# Patient Record
Sex: Male | Born: 1955 | Race: Black or African American | Hispanic: No | Smoking: Current some day smoker
Health system: Southern US, Community
[De-identification: ages and names within clinical notes are randomized; demographics above are authoritative.]

## PROBLEM LIST (undated history)

## (undated) DIAGNOSIS — I10 Essential (primary) hypertension: Secondary | ICD-10-CM

## (undated) DIAGNOSIS — I639 Cerebral infarction, unspecified: Secondary | ICD-10-CM

## (undated) HISTORY — DX: Cerebral infarction, unspecified: I63.9

## (undated) HISTORY — PX: SHOULDER SURGERY: SHX246

## (undated) HISTORY — DX: Essential (primary) hypertension: I10

---

## 2012-06-09 ENCOUNTER — Emergency Department (HOSPITAL_BASED_OUTPATIENT_CLINIC_OR_DEPARTMENT_OTHER): Payer: No Typology Code available for payment source

## 2012-06-09 ENCOUNTER — Emergency Department (HOSPITAL_BASED_OUTPATIENT_CLINIC_OR_DEPARTMENT_OTHER)
Admission: EM | Admit: 2012-06-09 | Discharge: 2012-06-09 | Disposition: A | Discharge status: Home / Self Care | Payer: No Typology Code available for payment source | Attending: Emergency Medicine | Admitting: Emergency Medicine

## 2012-06-09 ENCOUNTER — Encounter (HOSPITAL_BASED_OUTPATIENT_CLINIC_OR_DEPARTMENT_OTHER): Payer: Self-pay

## 2012-06-09 DIAGNOSIS — M542 Cervicalgia: Secondary | ICD-10-CM | POA: Insufficient documentation

## 2012-06-09 DIAGNOSIS — S335XXA Sprain of ligaments of lumbar spine, initial encounter: Secondary | ICD-10-CM | POA: Insufficient documentation

## 2012-06-09 DIAGNOSIS — S161XXA Strain of muscle, fascia and tendon at neck level, initial encounter: Secondary | ICD-10-CM

## 2012-06-09 DIAGNOSIS — S139XXA Sprain of joints and ligaments of unspecified parts of neck, initial encounter: Secondary | ICD-10-CM | POA: Insufficient documentation

## 2012-06-09 DIAGNOSIS — S39012A Strain of muscle, fascia and tendon of lower back, initial encounter: Secondary | ICD-10-CM

## 2012-06-09 DIAGNOSIS — R51 Headache: Secondary | ICD-10-CM | POA: Insufficient documentation

## 2012-06-09 MED ORDER — HYDROCODONE-ACETAMINOPHEN 5-325 MG PO TABS: 1.0000 | ORAL_TABLET | Freq: Four times a day (QID) | ORAL | Status: AC | PRN

## 2012-06-09 MED ORDER — NAPROXEN 500 MG PO TABS: 500.0000 mg | ORAL_TABLET | Freq: Two times a day (BID) | ORAL | Status: AC
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
## 2016-09-29 ENCOUNTER — Emergency Department (HOSPITAL_BASED_OUTPATIENT_CLINIC_OR_DEPARTMENT_OTHER)
Admission: EM | Admit: 2016-09-29 | Discharge: 2016-09-29 | Disposition: A | Discharge status: Home / Self Care | Payer: Worker's Compensation | Attending: Emergency Medicine | Admitting: Emergency Medicine

## 2016-09-29 ENCOUNTER — Encounter (HOSPITAL_BASED_OUTPATIENT_CLINIC_OR_DEPARTMENT_OTHER): Payer: Self-pay | Admitting: Emergency Medicine

## 2016-09-29 DIAGNOSIS — F17210 Nicotine dependence, cigarettes, uncomplicated: Secondary | ICD-10-CM | POA: Insufficient documentation

## 2016-09-29 DIAGNOSIS — Z7984 Long term (current) use of oral hypoglycemic drugs: Secondary | ICD-10-CM | POA: Insufficient documentation

## 2016-09-29 DIAGNOSIS — E119 Type 2 diabetes mellitus without complications: Secondary | ICD-10-CM | POA: Insufficient documentation

## 2016-09-29 DIAGNOSIS — Y92811 Bus as the place of occurrence of the external cause: Secondary | ICD-10-CM | POA: Insufficient documentation

## 2016-09-29 DIAGNOSIS — Y939 Activity, unspecified: Secondary | ICD-10-CM | POA: Insufficient documentation

## 2016-09-29 DIAGNOSIS — W1839XA Other fall on same level, initial encounter: Secondary | ICD-10-CM | POA: Insufficient documentation

## 2016-09-29 DIAGNOSIS — S39012A Strain of muscle, fascia and tendon of lower back, initial encounter: Secondary | ICD-10-CM | POA: Insufficient documentation

## 2016-09-29 DIAGNOSIS — Y990 Civilian activity done for income or pay: Secondary | ICD-10-CM | POA: Diagnosis not present

## 2016-09-29 DIAGNOSIS — S3992XA Unspecified injury of lower back, initial encounter: Secondary | ICD-10-CM | POA: Diagnosis present

## 2016-09-29 DIAGNOSIS — M5127 Other intervertebral disc displacement, lumbosacral region: Secondary | ICD-10-CM | POA: Diagnosis not present

## 2016-09-29 MED ORDER — METHYLPREDNISOLONE 4 MG PO TBPK: ORAL_TABLET | ORAL | 0 refills | Status: DC

## 2016-09-29 MED ORDER — CYCLOBENZAPRINE HCL 10 MG PO TABS: 10.0000 mg | ORAL_TABLET | Freq: Three times a day (TID) | ORAL | 0 refills | Status: DC | PRN

## 2016-09-29 MED ORDER — HYDROCODONE-ACETAMINOPHEN 5-325 MG PO TABS: 1.0000 | ORAL_TABLET | ORAL | 0 refills | Status: DC | PRN

## 2016-09-29 MED FILL — CYCLOBENZAPRINE 10 MG TAB: 10 | 10 days supply | Qty: 30 | Fill #0

## 2016-09-29 MED FILL — HYDROCODON-APAP 5-325: 5-325 | 3 days supply | Qty: 20 | Fill #0

## 2016-09-29 MED FILL — METHYLPREDNISOLONE 4 MG TAB: 4 | 6 days supply | Qty: 21 | Fill #0
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
## 2016-09-29 NOTE — ED Notes (Signed)
Pt verbalized understanding of discharge instructions and denies any further questions at this time.   

## 2016-09-29 NOTE — ED Notes (Signed)
ED Provider at bedside. 

## 2020-01-17 LAB — COLOGUARD: Cologuard: NEGATIVE

## 2022-01-16 ENCOUNTER — Encounter: Payer: Self-pay | Admitting: Nurse Practitioner

## 2022-01-16 ENCOUNTER — Ambulatory Visit (INDEPENDENT_AMBULATORY_CARE_PROVIDER_SITE_OTHER): Payer: Medicare (Managed Care) | Admitting: Nurse Practitioner

## 2022-01-16 VITALS — BP 122/74 | HR 91 | Temp 97.1°F | Ht 67.0 in | Wt 138.8 lb

## 2022-01-16 DIAGNOSIS — E119 Type 2 diabetes mellitus without complications: Secondary | ICD-10-CM

## 2022-01-16 DIAGNOSIS — Z720 Tobacco use: Secondary | ICD-10-CM | POA: Diagnosis not present

## 2022-01-16 DIAGNOSIS — E1140 Type 2 diabetes mellitus with diabetic neuropathy, unspecified: Secondary | ICD-10-CM | POA: Insufficient documentation

## 2022-01-16 MED ORDER — METFORMIN HCL ER 500 MG PO TB24: 500.0000 mg | ORAL_TABLET | Freq: Every day | ORAL | 1 refills | Status: DC

## 2022-01-16 NOTE — Assessment & Plan Note (Signed)
Currently smoking half a pack a day.  Encouraged complete tobacco cessation.  We will discuss low-dose lung CT and abdominal aortic aneurysm screening next visit. ?

## 2022-01-16 NOTE — Patient Instructions (Signed)
It was great to see you! ? ?We are checking your labs today and will call you with the results.  ? ?Let's follow-up in 3 months, sooner if you have concerns. ? ?If a referral was placed today, you will be contacted for an appointment. Please note that routine referrals can sometimes take up to 3-4 weeks to process. Please call our office if you haven't heard anything after this time frame. ? ?Take care, ? ?Vance Peper, NP ? ?

## 2022-01-16 NOTE — Assessment & Plan Note (Addendum)
We do not have previous records or lab results.  He was going to the [place] however he states that it was hard to get into see them.  We will check an A1c, CMP, CBC, lipid panel, urine microalbumin today.  Refill of metformin sent to the pharmacy 500 mg daily.  He does not take his medications routinely.  He was on a statin in the past however he stopped this for unknown reasons.  He is not taking any medications for kidney protection.  We will await lab results before starting any new medications.  Will adjust regimen based on A1c results.  He has not been to an eye doctor in a few years, recommended that he see one as soon as he is able to.  Discussed foot care, and he states that he does daily foot exams at home.  Follow-up in 3 months or sooner with any concerns or lab results that need to be followed up on. ?

## 2022-01-16 NOTE — Progress Notes (Signed)
? ?New Patient Office Visit ? ?Subjective:  ?Patient ID: Gary Gay, male    DOB: 1956-03-23  Age: 66 y.o. MRN: [id number] ? ?CC:  ?Chief Complaint  ?Patient presents with  ? Establish Care  ?  Np. Est care. Pt requesting blood sugars check  ? ? ?HPI ?Gary Gay presents for new patient visit to establish care.  Introduced to Designer, jewellery role and practice setting.  All questions answered.  Discussed provider/patient relationship and expectations. ? ?Gary Gay has a history of diabetes.  He takes metformin 500 mg daily if he remembers.  He also takes aspirin 81 mg about every other day.  He checks his blood sugars occasionally and they have ranged from 160s to 240s, both fasting and after eating.  He does not check his blood pressure at home.  He was taking a cholesterol medication in the past, however he stopped taking it for no particular reason.  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 ? ?DIABETES ? ?Hypoglycemic episodes:no ?Polydipsia/polyuria: no ?Visual disturbance: no ?Chest pain: no ?Paresthesias: no ?Glucose Monitoring: yes ? Accucheck frequency:  intermittent ? Fasting glucose: 160-247 ? Post prandial: ? Evening: ? Before meals: ?Taking Insulin?: no ? Long acting insulin: ? Short acting insulin: ?Blood Pressure Monitoring: rarely ?Retinal Examination: Not up to Date ?Foot Exam: Not up to Date ?Diabetic Education: Completed ?Pneumovax: Not up to Date ?Influenza: Not up to Date ?Aspirin: yes ? ? ?Past Medical History:  ?Diagnosis Date  ? Diabetes mellitus   ? ? ?Past Surgical History:  ?Procedure Laterality Date  ? SHOULDER SURGERY    ? ? ?Family History  ?Problem Relation Age of Onset  ? Cancer Sister   ?     pancreatic  ? Diabetes Maternal Grandmother   ? ? ?Social History  ? ?Socioeconomic History  ? Marital status: Divorced  ?  Spouse name: Not on file  ? Number of children: Not on file  ? Years of education: Not on file  ? Highest education level: Not on file   ?Occupational History  ? Not on file  ?Tobacco Use  ? Smoking status: Every Day  ?  Packs/day: 0.50  ?  Years: 49.00  ?  Pack years: 24.50  ?  Types: Cigarettes  ? Smokeless tobacco: Never  ?Vaping Use  ? Vaping Use: Never used  ?Substance and Sexual Activity  ? Alcohol use: Not Currently  ? Drug use: No  ? Sexual activity: Not on file  ?Other Topics Concern  ? Not on file  ?Social History Narrative  ? Not on file  ? ?Social Determinants of Health  ? ?Financial Resource Strain: Not on file  ?Food Insecurity: Not on file  ?Transportation Needs: Not on file  ?Physical Activity: Not on file  ?Stress: Not on file  ?Social Connections: Not on file  ?Intimate Partner Violence: Not on file  ? ? ?ROS ?Review of Systems  ?Constitutional: Negative.   ?HENT:  Positive for congestion. Negative for rhinorrhea and sore throat.   ?Eyes: Negative.   ?Respiratory: Negative.    ?Cardiovascular: Negative.   ?Gastrointestinal: Negative.   ?Genitourinary: Negative.   ?Musculoskeletal: Negative.   ?Skin:  Positive for rash (right elbow).  ?Neurological: Negative.   ?Psychiatric/Behavioral: Negative.    ? ?Objective:  ? ?Today's Vitals: BP 122/74 (BP Location: Left Arm, Cuff Size: Normal)   Pulse 91   Temp (!) 97.1 ?F (36.2 ?C) (Temporal)   Ht '5\' 7"'$  (1.702 m)  Wt 138 lb 12.8 oz (63 kg)   SpO2 99%   BMI 21.74 kg/m?  ? ?Physical Exam ?Vitals and nursing note reviewed.  ?Constitutional:   ?   Appearance: Normal appearance.  ?HENT:  ?   Head: Normocephalic and atraumatic.  ?   Right Ear: Tympanic membrane, ear canal and external ear normal.  ?   Left Ear: Tympanic membrane, ear canal and external ear normal.  ?   Nose: Nose normal.  ?   Mouth/Throat:  ?   Mouth: Mucous membranes are moist.  ?   Pharynx: Oropharynx is clear.  ?Eyes:  ?   Conjunctiva/sclera: Conjunctivae normal.  ?Cardiovascular:  ?   Rate and Rhythm: Normal rate and regular rhythm.  ?   Pulses: Normal pulses.  ?   Heart sounds: Normal heart sounds.  ?Pulmonary:  ?    Effort: Pulmonary effort is normal.  ?   Breath sounds: Normal breath sounds.  ?Abdominal:  ?   General: Bowel sounds are normal.  ?   Palpations: Abdomen is soft.  ?   Tenderness: There is no abdominal tenderness.  ?Musculoskeletal:     ?   General: Normal range of motion.  ?   Cervical back: Normal range of motion and neck supple. No tenderness.  ?   Right lower leg: No edema.  ?   Left lower leg: No edema.  ?Lymphadenopathy:  ?   Cervical: No cervical adenopathy.  ?Skin: ?   General: Skin is warm and dry.  ?Neurological:  ?   General: No focal deficit present.  ?   Mental Status: He is alert and oriented to person, place, and time.  ?   Cranial Nerves: No cranial nerve deficit.  ?   Gait: Gait normal.  ?   Deep Tendon Reflexes: Reflexes normal.  ?Psychiatric:     ?   Mood and Affect: Mood normal.     ?   Behavior: Behavior normal.     ?   Thought Content: Thought content normal.     ?   Judgment: Judgment normal.  ? ?Diabetic Foot Exam - Simple   ?Simple Foot Form ?Visual Inspection ?No deformities, no ulcerations, no other skin breakdown bilaterally: Yes ?Sensation Testing ?Intact to touch and monofilament testing bilaterally: Yes ?Pulse Check ?Posterior Tibialis and Dorsalis pulse intact bilaterally: Yes ?Comments ?  ? ? ?Assessment & Plan:  ? ?Problem List Items Addressed This Visit   ? ?  ? Endocrine  ? Diabetes mellitus without complication ([place]) - Primary  ?  We do not have previous records or lab results.  He was going to the [place] however he states that it was hard to get into see them.  We will check an A1c, CMP, CBC, lipid panel, urine microalbumin today.  Refill of metformin sent to the pharmacy 500 mg daily.  He does not take his medications routinely.  He was on a statin in the past however he stopped this for unknown reasons.  He is not taking any medications for kidney protection.  We will await lab results before starting any new medications.  Will adjust regimen based on A1c results.  He has not  been to an eye doctor in a few years, recommended that he see one as soon as he is able to.  Discussed foot care, and he states that he does daily foot exams at home.  Follow-up in 3 months or sooner with any concerns or lab results that need to be  followed up on. ?  ?  ? Relevant Medications  ? aspirin 81 MG EC tablet  ? metFORMIN (GLUCOPHAGE-XR) 500 MG 24 hr tablet  ? Other Relevant Orders  ? CBC with Differential/Platelet  ? Comprehensive metabolic panel  ? Hemoglobin A1c  ? Lipid panel  ? Microalbumin / creatinine urine ratio  ?  ? Other  ? Tobacco use  ?  Currently smoking half a pack a day.  Encouraged complete tobacco cessation.  We will discuss low-dose lung CT and abdominal aortic aneurysm screening next visit. ?  ?  ? ? ?Outpatient Encounter Medications as of 01/16/2022  ?Medication Sig  ? aspirin 81 MG EC tablet TAKE ONE TABLET BY MOUTH AS DIRECTED BY YOUR MEDICAL PROVIDER  ? metFORMIN (GLUCOPHAGE-XR) 500 MG 24 hr tablet Take 1 tablet (500 mg total) by mouth daily with breakfast.  ? [DISCONTINUED] cyclobenzaprine (FLEXERIL) 10 MG tablet Take 1 tablet (10 mg total) by mouth 3 (three) times daily as needed for muscle spasms.  ? [DISCONTINUED] HYDROcodone-acetaminophen (NORCO/VICODIN) 5-325 MG tablet Take 1-2 tablets by mouth every 4 (four) hours as needed for moderate pain or severe pain.  ? [DISCONTINUED] metFORMIN (GLUCOPHAGE-XR) 500 MG 24 hr tablet Take 500 mg by mouth daily with breakfast.  ? [DISCONTINUED] metFORMIN (GLUCOPHAGE-XR) 500 MG 24 hr tablet Take 1 tablet (500 mg total) by mouth daily with breakfast.  ? [DISCONTINUED] methylPREDNISolone (MEDROL DOSEPAK) 4 MG TBPK tablet Take per pack instruction  ? ?No facility-administered encounter medications on file as of 01/16/2022.  ? ? ?Follow-up: Return in about 3 months (around 04/18/2022) for Diabetes.  ? ?Charyl Dancer, NP ? ?

## 2022-01-17 LAB — LIPID PANEL
Cholesterol: 169 mg/dL (ref 0–200)
HDL: 40 mg/dL (ref >39.00)
LDL Cholesterol: 107 mg/dL — ABNORMAL HIGH (ref 0–99)
NonHDL: 128.99
Total CHOL/HDL Ratio: 4
Triglycerides: 108 mg/dL (ref 0.0–149.0)
VLDL: 21.6 mg/dL (ref 0.0–40.0)

## 2022-01-17 LAB — COMPREHENSIVE METABOLIC PANEL
ALT: 8 U/L (ref 0–53)
AST: 24 U/L (ref 0–37)
Albumin: 4.7 g/dL (ref 3.5–5.2)
Alkaline Phosphatase: 102 U/L (ref 39–117)
BUN: 4 mg/dL — ABNORMAL LOW (ref 6–23)
CO2: 31 mEq/L (ref 19–32)
Calcium: 9.7 mg/dL (ref 8.4–10.5)
Chloride: 99 mEq/L (ref 96–112)
Creatinine, Ser: 0.98 mg/dL (ref 0.40–1.50)
GFR: 80.92 mL/min (ref >60.00)
Glucose, Bld: 242 mg/dL — ABNORMAL HIGH (ref 70–99)
Potassium: 4.1 mEq/L (ref 3.5–5.1)
Sodium: 137 mEq/L (ref 135–145)
Total Bilirubin: 0.6 mg/dL (ref 0.2–1.2)
Total Protein: 7.9 g/dL (ref 6.0–8.3)

## 2022-01-17 LAB — MICROALBUMIN / CREATININE URINE RATIO
Creatinine,U: 67.6 mg/dL
Microalb Creat Ratio: 1.2 mg/g (ref 0.0–30.0)
Microalb, Ur: 0.8 mg/dL (ref 0.0–1.9)

## 2022-01-17 LAB — CBC WITH DIFFERENTIAL/PLATELET
Basophils Absolute: 0.1 10*3/uL (ref 0.0–0.1)
Basophils Relative: 1.5 % (ref 0.0–3.0)
Eosinophils Absolute: 0.2 10*3/uL (ref 0.0–0.7)
Eosinophils Relative: 2.9 % (ref 0.0–5.0)
HCT: 35.5 % — ABNORMAL LOW (ref 39.0–52.0)
Hemoglobin: 11.6 g/dL — ABNORMAL LOW (ref 13.0–17.0)
Lymphocytes Relative: 47.4 % — ABNORMAL HIGH (ref 12.0–46.0)
Lymphs Abs: 2.6 10*3/uL (ref 0.7–4.0)
MCHC: 32.6 g/dL (ref 30.0–36.0)
MCV: 82.3 fl (ref 78.0–100.0)
Monocytes Absolute: 0.4 10*3/uL (ref 0.1–1.0)
Monocytes Relative: 6.8 % (ref 3.0–12.0)
Neutro Abs: 2.3 10*3/uL (ref 1.4–7.7)
Neutrophils Relative %: 41.4 % — ABNORMAL LOW (ref 43.0–77.0)
Platelets: 398 10*3/uL (ref 150.0–400.0)
RBC: 4.32 Mil/uL (ref 4.22–5.81)
RDW: 14.4 % (ref 11.5–15.5)
WBC: 5.5 10*3/uL (ref 4.0–10.5)

## 2022-01-17 LAB — HEMOGLOBIN A1C: Hgb A1c MFr Bld: 12.8 % — ABNORMAL HIGH (ref 4.6–6.5)

## 2022-01-20 NOTE — Progress Notes (Signed)
Called and informed patient of results and provider instructions. Patient voiced understanding. Scheduled pt for f/u appt 01/21/22 '@920'$ 

## 2022-01-21 ENCOUNTER — Ambulatory Visit (INDEPENDENT_AMBULATORY_CARE_PROVIDER_SITE_OTHER): Payer: Medicare (Managed Care) | Admitting: Nurse Practitioner

## 2022-01-21 ENCOUNTER — Other Ambulatory Visit: Payer: Self-pay

## 2022-01-21 ENCOUNTER — Encounter: Payer: Self-pay | Admitting: Nurse Practitioner

## 2022-01-21 VITALS — BP 130/88 | HR 83 | Temp 96.5°F | Wt 135.2 lb

## 2022-01-21 DIAGNOSIS — E1165 Type 2 diabetes mellitus with hyperglycemia: Secondary | ICD-10-CM | POA: Diagnosis not present

## 2022-01-21 DIAGNOSIS — E119 Type 2 diabetes mellitus without complications: Secondary | ICD-10-CM

## 2022-01-21 MED ORDER — EMPAGLIFLOZIN 10 MG PO TABS: 10.0000 mg | ORAL_TABLET | Freq: Every day | ORAL | 2 refills | Status: DC

## 2022-01-21 MED ORDER — METFORMIN HCL ER 500 MG PO TB24: 1000.0000 mg | ORAL_TABLET | Freq: Every day | ORAL | 0 refills | Status: DC

## 2022-01-21 MED ORDER — LISINOPRIL 5 MG PO TABS: 5.0000 mg | ORAL_TABLET | Freq: Every day | ORAL | 2 refills | Status: DC

## 2022-01-21 NOTE — Assessment & Plan Note (Signed)
Chronic, not controlled. Gary Gay's A1C was 12.8% on 01/17/22. We discussed the importance of limiting sugars including soda and sweet tea, along with the importance of taking his medicine. We discussed possible complications of elevated blood sugars. Encouraged him to continue taking metformin ER 1,'000mg'$  daily. Will also start Jardiance '10mg'$  daily. Note written for [facility] physical provider letting them know Fox is starting more treatment for his diabetes and encouraged Tyke to contact that provider to see what else he needs to do. Will also start lisinopril '5mg'$  daily for kidney protection. He was prescribed a cholesterol medication in the past and has it at home. Encouraged him to call with the name and dose of the medication so his chart can be updated. Follow up in 4 weeks.  ?

## 2022-01-21 NOTE — Patient Instructions (Signed)
It was great to see you! ? ?Start jardiance '10mg'$  (1 tablet) daily to help bring your blood sugars down.  ? ?Start lisinopril '5mg'$  (1 tablet) daily to help protect your kidneys.  ? ?Keep checking your blood sugar in the morning when you wake up before you eat.  ? ?Let's follow-up in 1 month, sooner if you have concerns. ? ?If a referral was placed today, you will be contacted for an appointment. Please note that routine referrals can sometimes take up to 3-4 weeks to process. Please call our office if you haven't heard anything after this time frame. ? ?Take care, ? ?Vance Peper, NP ? ? ?

## 2022-01-21 NOTE — Progress Notes (Signed)
? ?Established Patient Office Visit ? ?Subjective:  ?Patient ID: Jewelz Kobus, male    DOB: Jun 27, 1956  Age: 66 y.o. MRN: [id number] ? ?CC:  ?Chief Complaint  ?Patient presents with  ? Blood Sugar Problem  ?  F/u elevated BS  ? ? ?HPI ?Jevante Hollibaugh presents for follow-up on blood sugars. His last A1C was 12.8%. He endorses drinking a lot of pepsi and sweet tea. He has tried drinking diet pepsi, but he didn't like it. He has also tried to cut back on drinking soda and tea, however he was unable to do so. He sometimes does not remember to take his metformin. He has a glucometer at home but doesn't check his blood sugar routinely. He denies chest pain, shortness of breath, dysuria, changes in vision, and neuropathy.  ? ?Past Medical History:  ?Diagnosis Date  ? Diabetes mellitus   ? ? ?Past Surgical History:  ?Procedure Laterality Date  ? SHOULDER SURGERY    ? ? ?Family History  ?Problem Relation Age of Onset  ? Cancer Sister   ?     pancreatic  ? Diabetes Maternal Grandmother   ? ? ?Social History  ? ?Socioeconomic History  ? Marital status: Divorced  ?  Spouse name: Not on file  ? Number of children: Not on file  ? Years of education: Not on file  ? Highest education level: Not on file  ?Occupational History  ? Not on file  ?Tobacco Use  ? Smoking status: Every Day  ?  Packs/day: 0.50  ?  Years: 49.00  ?  Pack years: 24.50  ?  Types: Cigarettes  ? Smokeless tobacco: Never  ?Vaping Use  ? Vaping Use: Never used  ?Substance and Sexual Activity  ? Alcohol use: Not Currently  ? Drug use: No  ? Sexual activity: Not on file  ?Other Topics Concern  ? Not on file  ?Social History Narrative  ? Not on file  ? ?Social Determinants of Health  ? ?Financial Resource Strain: Not on file  ?Food Insecurity: Not on file  ?Transportation Needs: Not on file  ?Physical Activity: Not on file  ?Stress: Not on file  ?Social Connections: Not on file  ?Intimate Partner Violence: Not on file  ? ? ?Outpatient Medications Prior to Visit   ?Medication Sig Dispense Refill  ? aspirin 81 MG EC tablet TAKE ONE TABLET BY MOUTH AS DIRECTED BY YOUR MEDICAL PROVIDER    ? metFORMIN (GLUCOPHAGE-XR) 500 MG 24 hr tablet Take 1 tablet (500 mg total) by mouth daily with breakfast. 90 tablet 1  ? ?No facility-administered medications prior to visit.  ? ? ?No Known Allergies ? ?ROS ?Review of Systems ?See pertinent positives and negatives per HPI. ?  ?Objective:  ?  ?Physical Exam ?Vitals and nursing note reviewed.  ?Constitutional:   ?   Appearance: Normal appearance.  ?HENT:  ?   Head: Normocephalic.  ?Eyes:  ?   Conjunctiva/sclera: Conjunctivae normal.  ?Cardiovascular:  ?   Rate and Rhythm: Normal rate and regular rhythm.  ?   Pulses: Normal pulses.  ?   Heart sounds: Normal heart sounds.  ?Pulmonary:  ?   Effort: Pulmonary effort is normal.  ?   Breath sounds: Normal breath sounds.  ?Musculoskeletal:  ?   Cervical back: Normal range of motion.  ?Skin: ?   General: Skin is warm and dry.  ?Neurological:  ?   General: No focal deficit present.  ?   Mental Status: He is alert and  oriented to person, place, and time.  ?Psychiatric:     ?   Mood and Affect: Mood normal.     ?   Behavior: Behavior normal.     ?   Thought Content: Thought content normal.     ?   Judgment: Judgment normal.  ? ? ?BP 130/88 (BP Location: Left Arm, Patient Position: Sitting, Cuff Size: Normal)   Pulse 83   Temp (!) 96.5 ?F (35.8 ?C) (Temporal)   Wt 135 lb 3.2 oz (61.3 kg)   SpO2 98%   BMI 21.18 kg/m?  ?Wt Readings from Last 3 Encounters:  ?01/21/22 135 lb 3.2 oz (61.3 kg)  ?01/16/22 138 lb 12.8 oz (63 kg)  ?09/29/16 149 lb (67.6 kg)  ? ? ? ?Health Maintenance Due  ?Topic Date Due  ? FOOT EXAM  Never done  ? OPHTHALMOLOGY EXAM  Never done  ? HIV Screening  Never done  ? Hepatitis C Screening  Never done  ? COLONOSCOPY (Pts 45-45yr Insurance coverage will need to be confirmed)  Never done  ? Zoster Vaccines- Shingrix (1 of 2) Never done  ? Pneumonia Vaccine 66 Years old (2 - PCV)  04/09/2016  ? ? ?There are no preventive care reminders to display for this patient. ? ?No results found for: TSH ?Lab Results  ?Component Value Date  ? WBC 5.5 01/16/2022  ? HGB 11.6 (L) 01/16/2022  ? HCT 35.5 (L) 01/16/2022  ? MCV 82.3 01/16/2022  ? PLT 398.0 01/16/2022  ? ?Lab Results  ?Component Value Date  ? NA 137 01/16/2022  ? K 4.1 01/16/2022  ? CO2 31 01/16/2022  ? GLUCOSE 242 (H) 01/16/2022  ? BUN 4 (L) 01/16/2022  ? CREATININE 0.98 01/16/2022  ? BILITOT 0.6 01/16/2022  ? ALKPHOS 102 01/16/2022  ? AST 24 01/16/2022  ? ALT 8 01/16/2022  ? PROT 7.9 01/16/2022  ? ALBUMIN 4.7 01/16/2022  ? CALCIUM 9.7 01/16/2022  ? GFR 80.92 01/16/2022  ? ?Lab Results  ?Component Value Date  ? CHOL 169 01/16/2022  ? ?Lab Results  ?Component Value Date  ? HDL 40.00 01/16/2022  ? ?Lab Results  ?Component Value Date  ? LDLCALC 107 (H) 01/16/2022  ? ?Lab Results  ?Component Value Date  ? TRIG 108.0 01/16/2022  ? ?Lab Results  ?Component Value Date  ? CHOLHDL 4 01/16/2022  ? ?Lab Results  ?Component Value Date  ? HGBA1C 12.8 Repeated and verified X2. (H) 01/16/2022  ? ? ?  ?Assessment & Plan:  ? ?Problem List Items Addressed This Visit   ? ?  ? Endocrine  ? Diabetes mellitus without complication ([facility] - Primary  ?  Chronic, not controlled. Vernis's A1C was 12.8% on 01/17/22. We discussed the importance of limiting sugars including soda and sweet tea, along with the importance of taking his medicine. We discussed possible complications of elevated blood sugars. Encouraged him to continue taking metformin ER 1,'000mg'$  daily. Will also start Jardiance '10mg'$  daily. Note written for [facility] physical provider letting them know MChinonsois starting more treatment for his diabetes and encouraged MTrayvondto contact that provider to see what else he needs to do. Will also start lisinopril '5mg'$  daily for kidney protection. He was prescribed a cholesterol medication in the past and has it at home. Encouraged him to call with the name and dose of the  medication so his chart can be updated. Follow up in 4 weeks.  ?  ?  ? Relevant Medications  ? empagliflozin (JARDIANCE) 10  MG TABS tablet  ? metFORMIN (GLUCOPHAGE-XR) 500 MG 24 hr tablet  ? lisinopril (ZESTRIL) 5 MG tablet  ? ? ?Meds ordered this encounter  ?Medications  ? empagliflozin (JARDIANCE) 10 MG TABS tablet  ?  Sig: Take 1 tablet (10 mg total) by mouth daily before breakfast.  ?  Dispense:  30 tablet  ?  Refill:  2  ? metFORMIN (GLUCOPHAGE-XR) 500 MG 24 hr tablet  ?  Sig: Take 2 tablets (1,000 mg total) by mouth daily with breakfast.  ?  Dispense:  180 tablet  ?  Refill:  0  ? lisinopril (ZESTRIL) 5 MG tablet  ?  Sig: Take 1 tablet (5 mg total) by mouth daily.  ?  Dispense:  30 tablet  ?  Refill:  2  ? ? ?Follow-up: Return in about 4 weeks (around 02/18/2022) for Diabetes.  ? ? ?Charyl Dancer, NP ?

## 2022-02-03 ENCOUNTER — Telehealth: Payer: Self-pay | Admitting: Nurse Practitioner

## 2022-02-03 ENCOUNTER — Ambulatory Visit: Payer: Medicare (Managed Care) | Admitting: Nurse Practitioner

## 2022-02-03 NOTE — Telephone Encounter (Signed)
Patient/Caregiver was notified of No Show/Late Cancellation Policy & possible $41 charge. ?Visit was cancelled with reason "No Show/Cancel within 24 hours" for tracking & charging. ? ?Caller Name: Ygnacio Edelson  ?Caller Ph #: [phone] ?Date of APPT: 02/03/22 ?Reason given for no show/late cancellation: he is broken down on side of the road.  ?No Show Letter printed & put in outgoing mail (Yes/No): no I didn't send, not sure if I should ? ?~~~Route message to admin supervisor and clinical team/CMA~~~ ? ? ?

## 2022-02-04 NOTE — Telephone Encounter (Signed)
No, not counted as no show/late cancellation due to circumstance. No fee either. ?

## 2022-02-24 ENCOUNTER — Emergency Department (HOSPITAL_BASED_OUTPATIENT_CLINIC_OR_DEPARTMENT_OTHER): Payer: Medicare (Managed Care)

## 2022-02-24 ENCOUNTER — Encounter (HOSPITAL_BASED_OUTPATIENT_CLINIC_OR_DEPARTMENT_OTHER): Payer: Self-pay

## 2022-02-24 ENCOUNTER — Observation Stay (HOSPITAL_BASED_OUTPATIENT_CLINIC_OR_DEPARTMENT_OTHER)
Admission: EM | Admit: 2022-02-24 | Discharge: 2022-02-25 | Disposition: A | Discharge status: Home / Self Care | Payer: Medicare (Managed Care) | Attending: Internal Medicine | Admitting: Internal Medicine

## 2022-02-24 ENCOUNTER — Other Ambulatory Visit: Payer: Self-pay

## 2022-02-24 DIAGNOSIS — E1169 Type 2 diabetes mellitus with other specified complication: Secondary | ICD-10-CM | POA: Insufficient documentation

## 2022-02-24 DIAGNOSIS — Z720 Tobacco use: Secondary | ICD-10-CM | POA: Diagnosis present

## 2022-02-24 DIAGNOSIS — I10 Essential (primary) hypertension: Secondary | ICD-10-CM | POA: Diagnosis present

## 2022-02-24 DIAGNOSIS — M4807 Spinal stenosis, lumbosacral region: Secondary | ICD-10-CM | POA: Diagnosis not present

## 2022-02-24 DIAGNOSIS — M79604 Pain in right leg: Secondary | ICD-10-CM | POA: Insufficient documentation

## 2022-02-24 DIAGNOSIS — R200 Anesthesia of skin: Secondary | ICD-10-CM | POA: Diagnosis present

## 2022-02-24 DIAGNOSIS — Z7984 Long term (current) use of oral hypoglycemic drugs: Secondary | ICD-10-CM | POA: Insufficient documentation

## 2022-02-24 DIAGNOSIS — Z79899 Other long term (current) drug therapy: Secondary | ICD-10-CM | POA: Insufficient documentation

## 2022-02-24 DIAGNOSIS — C07 Malignant neoplasm of parotid gland: Secondary | ICD-10-CM | POA: Diagnosis not present

## 2022-02-24 DIAGNOSIS — E785 Hyperlipidemia, unspecified: Secondary | ICD-10-CM | POA: Diagnosis not present

## 2022-02-24 DIAGNOSIS — E1140 Type 2 diabetes mellitus with diabetic neuropathy, unspecified: Secondary | ICD-10-CM

## 2022-02-24 DIAGNOSIS — I639 Cerebral infarction, unspecified: Principal | ICD-10-CM | POA: Insufficient documentation

## 2022-02-24 DIAGNOSIS — Z7982 Long term (current) use of aspirin: Secondary | ICD-10-CM | POA: Diagnosis not present

## 2022-02-24 DIAGNOSIS — I6381 Other cerebral infarction due to occlusion or stenosis of small artery: Secondary | ICD-10-CM

## 2022-02-24 DIAGNOSIS — E119 Type 2 diabetes mellitus without complications: Secondary | ICD-10-CM

## 2022-02-24 DIAGNOSIS — F17210 Nicotine dependence, cigarettes, uncomplicated: Secondary | ICD-10-CM | POA: Diagnosis not present

## 2022-02-24 DIAGNOSIS — I8291 Chronic embolism and thrombosis of unspecified vein: Secondary | ICD-10-CM | POA: Insufficient documentation

## 2022-02-24 DIAGNOSIS — I8290 Acute embolism and thrombosis of unspecified vein: Secondary | ICD-10-CM | POA: Diagnosis present

## 2022-02-24 HISTORY — DX: Other cerebral infarction due to occlusion or stenosis of small artery: I63.81

## 2022-02-24 LAB — CBC
HCT: 36.4 % — ABNORMAL LOW (ref 39.0–52.0)
Hemoglobin: 11.8 g/dL — ABNORMAL LOW (ref 13.0–17.0)
MCH: 26.7 pg (ref 26.0–34.0)
MCHC: 32.4 g/dL (ref 30.0–36.0)
MCV: 82.4 fL (ref 80.0–100.0)
Platelets: 341 10*3/uL (ref 150–400)
RBC: 4.42 MIL/uL (ref 4.22–5.81)
RDW: 14.7 % (ref 11.5–15.5)
WBC: 6.3 10*3/uL (ref 4.0–10.5)
nRBC: 0 % (ref 0.0–0.2)

## 2022-02-24 LAB — COMPREHENSIVE METABOLIC PANEL
ALT: 9 U/L (ref 0–44)
AST: 28 U/L (ref 15–41)
Albumin: 4.3 g/dL (ref 3.5–5.0)
Alkaline Phosphatase: 76 U/L (ref 38–126)
Anion gap: 6 (ref 5–15)
BUN: 6 mg/dL — ABNORMAL LOW (ref 8–23)
CO2: 29 mmol/L (ref 22–32)
Calcium: 9.5 mg/dL (ref 8.9–10.3)
Chloride: 104 mmol/L (ref 98–111)
Creatinine, Ser: 0.87 mg/dL (ref 0.61–1.24)
GFR, Estimated: >60 mL/min (ref >60)
Glucose, Bld: 161 mg/dL — ABNORMAL HIGH (ref 70–99)
Potassium: 3.6 mmol/L (ref 3.5–5.1)
Sodium: 139 mmol/L (ref 135–145)
Total Bilirubin: 0.5 mg/dL (ref 0.3–1.2)
Total Protein: 8.6 g/dL — ABNORMAL HIGH (ref 6.5–8.1)

## 2022-02-24 LAB — PROTIME-INR
INR: 1 (ref 0.8–1.2)
Prothrombin Time: 13.2 seconds (ref 11.4–15.2)

## 2022-02-24 LAB — DIFFERENTIAL
Abs Immature Granulocytes: 0.01 10*3/uL (ref 0.00–0.07)
Basophils Absolute: 0 10*3/uL (ref 0.0–0.1)
Basophils Relative: 1 %
Eosinophils Absolute: 0.2 10*3/uL (ref 0.0–0.5)
Eosinophils Relative: 4 %
Immature Granulocytes: 0 %
Lymphocytes Relative: 51 %
Lymphs Abs: 3.2 10*3/uL (ref 0.7–4.0)
Monocytes Absolute: 0.6 10*3/uL (ref 0.1–1.0)
Monocytes Relative: 9 %
Neutro Abs: 2.2 10*3/uL (ref 1.7–7.7)
Neutrophils Relative %: 35 %

## 2022-02-24 LAB — APTT: aPTT: 36 seconds (ref 24–36)

## 2022-02-24 LAB — CBG MONITORING, ED: Glucose-Capillary: 122 mg/dL — ABNORMAL HIGH (ref 70–99)

## 2022-02-24 MED ORDER — ASPIRIN 81 MG PO CHEW
324.0000 mg | CHEWABLE_TABLET | Freq: Once | ORAL | Status: AC
  Administered 2022-02-24: 324 mg via ORAL
  Filled 2022-02-24: qty 4

## 2022-02-24 MED ORDER — SODIUM CHLORIDE 0.9% FLUSH
3.0000 mL | Freq: Once | INTRAVENOUS | Status: DC
  Filled 2022-02-24: qty 3

## 2022-02-24 NOTE — Progress Notes (Signed)
  [facility] will assume care on arrival to accepting facility. Until arrival, care as per EDP. However, [facility] available 24/7 for questions and assistance.   Nursing staff please page [facility] Admits and Consults ([phone]) as soon as the patient arrives to the hospital.  Kenzley Ke, DO [facility]  

## 2022-02-24 NOTE — ED Notes (Signed)
Pt. Reports he has had numbness and pain in the back of the R thigh since Thurs. Last week.  Pt. Also reports his R arm has had a weak feeling.   ?Upon assessment the Pt. Has no weakness and no facial drooping no trouble walking.  Pt. Is alert and oriented.  Pt. In no resp. Distress. ?

## 2022-02-24 NOTE — ED Notes (Signed)
NIH was performed by ED MD/ with ED RN ?

## 2022-02-24 NOTE — ED Provider Notes (Signed)
?[facility]PART[facility] ?Provider Note ? ? ?CSN: [id number] ?Arrival date & time: 02/24/22  1652 ? ?  ? ?History ? ?Chief Complaint  ?Patient presents with  ? Numbness  ? ? ?Gary Gay is a 66 y.o. male. ? ?Patient with a history of diabetes on metformin presenting with a 5-day history of "swelling", pain and numbness to his right leg.  He is also having some numbness to his right arm, right neck and right side of his face.  Reports symptoms have been going for at least 4 to 5 days.  Denies any fall or trauma.  He complains of pain at his right buttock that radiates down his right leg stopping before the knee.  He feels this leg is swollen.  He says the whole leg is numb and tingly but not weak.  He is also having numbness involving his entire right arm with some weakness and dropping objects at home.  He is having numbness as well to his right neck and right face.  Denies any fall or trauma.  Denies any head, neck or back pain.  No chest pain or shortness of breath.  No visual changes.  Did have some intermittent weakness with the right arm.  Denies any neck pain or back pain.  Most of his pain is to his right buttock and down his right leg with some associated numbness and subjective swelling.  Denies any blood thinner use.  Denies any direct trauma.  States that he recently changed a car engine without asking for help ? ?The history is provided by the patient.  ? ?  ? ?Home Medications ?Prior to Admission medications   ?Medication Sig Start Date End Date Taking? Authorizing Provider  ?aspirin 81 MG EC tablet TAKE ONE TABLET BY MOUTH AS DIRECTED BY YOUR MEDICAL PROVIDER 01/28/20   [provider]  ?empagliflozin (JARDIANCE) 10 MG TABS tablet Take 1 tablet (10 mg total) by mouth daily before breakfast. 01/21/22   McElwee, Lauren A, NP  ?lisinopril (ZESTRIL) 5 MG tablet Take 1 tablet (5 mg total) by mouth daily. 01/21/22   McElwee, Scheryl Darter, NP  ?metFORMIN (GLUCOPHAGE-XR) 500 MG 24 hr tablet  Take 2 tablets (1,000 mg total) by mouth daily with breakfast. 01/21/22   McElwee, Scheryl Darter, NP  ?   ? ?Allergies    ?Patient has no known allergies.   ? ?Review of Systems   ?Review of Systems  ?Constitutional:  Negative for activity change, appetite change and fever.  ?HENT:  Negative for congestion.   ?Respiratory:  Negative for cough, chest tightness and shortness of breath.   ?Cardiovascular:  Positive for leg swelling. Negative for chest pain.  ?Gastrointestinal:  Negative for abdominal pain, nausea and vomiting.  ?Genitourinary:  Negative for dysuria and hematuria.  ?Musculoskeletal:  Positive for arthralgias and myalgias. Negative for back pain.  ?Skin:  Negative for rash.  ?Neurological:  Positive for weakness and numbness. Negative for dizziness and headaches.  ? all other systems are negative except as noted in the HPI and PMH.  ? ?Physical Exam ?Updated Vital Signs ?BP (!) 168/86 (BP Location: Left Arm)   Pulse 82   Temp 98.3 ?F (36.8 ?C) (Oral)   Resp 20   Ht '5\' 7"'$  (1.702 m)   Wt 64.4 kg   SpO2 100%   BMI 22.24 kg/m?  ?Physical Exam ?Vitals and nursing note reviewed.  ?Constitutional:   ?   General: He is not in acute distress. ?  Appearance: He is well-developed.  ?HENT:  ?   Head: Normocephalic and atraumatic.  ?   Mouth/Throat:  ?   Pharynx: No oropharyngeal exudate.  ?Eyes:  ?   Conjunctiva/sclera: Conjunctivae normal.  ?   Pupils: Pupils are equal, round, and reactive to light.  ?Neck:  ?   Comments: No meningismus. ?Cardiovascular:  ?   Rate and Rhythm: Normal rate and regular rhythm.  ?   Heart sounds: Normal heart sounds. No murmur heard. ?Pulmonary:  ?   Effort: Pulmonary effort is normal. No respiratory distress.  ?   Breath sounds: Normal breath sounds.  ?Abdominal:  ?   Palpations: Abdomen is soft.  ?   Tenderness: There is no abdominal tenderness. There is no guarding or rebound.  ?Musculoskeletal:     ?   General: No tenderness. Normal range of motion.  ?   Cervical back: Normal  range of motion and neck supple.  ?Skin: ?   General: Skin is warm.  ?Neurological:  ?   Mental Status: He is alert and oriented to person, place, and time.  ?   Cranial Nerves: No cranial nerve deficit.  ?   Motor: No abnormal muscle tone.  ?   Coordination: Coordination normal.  ?   Comments: CN 2-12 intact, no ataxia on finger to nose, no nystagmus, 5/5 strength throughout, no pronator drift, Romberg negative, normal gait. ? ?Subjective numbness involving right upper leg, right arm, right face and right neck. ? ?No appreciable swelling of legs.  Intact DP and PT pulses.  Compartments are soft  ?Psychiatric:     ?   Behavior: Behavior normal.  ? ? ?ED Results / Procedures / Treatments   ?Labs ?(all labs ordered are listed, but only abnormal results are displayed) ?Labs Reviewed  ?CBC - Abnormal; Notable for the following components:  ?    Result Value  ? Hemoglobin 11.8 (*)   ? HCT 36.4 (*)   ? All other components within normal limits  ?COMPREHENSIVE METABOLIC PANEL - Abnormal; Notable for the following components:  ? Glucose, Bld 161 (*)   ? BUN 6 (*)   ? Total Protein 8.6 (*)   ? All other components within normal limits  ?CBG MONITORING, ED - Abnormal; Notable for the following components:  ? Glucose-Capillary 122 (*)   ? All other components within normal limits  ?PROTIME-INR  ?APTT  ?DIFFERENTIAL  ? ? ?EKG ?EKG Interpretation ? ?Date/Time:  Monday Feb 24 2022 17:40:07 EDT ?Ventricular Rate:  79 ?PR Interval:  150 ?QRS Duration: 96 ?QT Interval:  380 ?QTC Calculation: 435 ?R Axis:   84 ?Text Interpretation: Normal sinus rhythm Biatrial enlargement ST elevation, consider early repolarization Abnormal ECG No previous ECGs available No previous ECGs available Confirmed by Ezequiel Essex [phone]) on 02/24/2022 5:45:42 PM ? ?Radiology ?No results found. ? ?Procedures ?Procedures  ? ? ?Medications Ordered in ED ?Medications  ?sodium chloride flush (NS) 0.9 % injection 3 mL ( Intravenous Canceled Entry 02/24/22 1810)   ? ? ?ED Course/ Medical Decision Making/ A&P ?  ?                        ?Medical Decision Making ?Amount and/or Complexity of Data Reviewed ?Labs: ordered. ?Radiology: ordered. ? ?Risk ?OTC drugs. ?Decision regarding hospitalization. ? ?Right-sided numbness for the past 5 days.  Also having numbness and pain involving his right buttock down his right thigh.  Intact distal pulses.  Intact grip strength. ? ?  Neuro intact other than right-sided subjective numbness.  Code stroke not activated due to delay in presentation ? ?CT is discussed with Dr. Sherrye Payor of radiology.  Reviewed and interpreted by me as well ? ?1. 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 ?Correlation with MRI is recommended.  ? ?D/w patient. Will plan for admission and stroke work up as well as MRI. Will obtain MRI L spine given R leg numbness and pain as well but suspect majority of numbness is due to CVA. ?Neurology consult placed. ?Admission d/w Dr. Bridgett Larsson.  ? ? ? ? ? ? ? ?Final Clinical Impression(s) / ED Diagnoses ?Final diagnoses:  ?Cerebrovascular accident (CVA), unspecified mechanism ([place])  ? ? ?Rx / DC Orders ?ED Discharge Orders   ? ? None  ? ?  ? ? ?  ?Ezequiel Essex, MD ?02/25/22 0101 ? ?

## 2022-02-24 NOTE — ED Notes (Signed)
Dr. Nicoletta Dress with teleneuro on to see patient. ?

## 2022-02-24 NOTE — ED Triage Notes (Addendum)
Pt ambulatory to triage. Reports swelling to right side of body with right arm and leg pain and posterior leg swelling since last Wednesday . Numbness to right arm and hand. Decreased sensation to right side of face . Denies HA or  No injury Pt with equal grips.  Repots dropping cigarette from right hand this morning ?

## 2022-02-24 NOTE — Consult Note (Signed)
[facility] ? ?Stat Consult ? ?Patient Name:   Gary Gay, Gary Gay ?Date of Birth:   Nov 18, 1955 ?Identification Number:   MRN - [id number] ?Date of Service:   02/24/2022 20:14:08 ? ?Diagnosis: ?      R20.2 - Paresthesia of skin ? ?Impression ?66 year old male, history of hypertension and diabetes, here with four to five days of right-sided numbness and soreness, found to have a hypodensity in the left thalamus concerning for an acute Lacunar infarct. NIH Stroke Scale is 1. He is not a candidate for thrombolytics. He needs to be admitted for a formal stroke work up. ? ?PLAN ?- MRI brain w/o contrast ?- TTE w/bubble ?- check a1c and LDL ?- monitor on tele for afib ?- neuro to follow ? ?--- ? ? ?Advanced Imaging: ?Advanced Imaging Deferred because: ?not suggestive of LVO ? ? ?Metrics: ?[facility] Notification Time: 02/24/2022 20:09:27 ?Stamp Time: 02/24/2022 20:14:08 ?Callback Response Time: 02/24/2022 20:10:52 ? ? ?---------------------------------------------------------------------------------------------------- ? ?Chief Complaint: ?R sided numbness and soreness ? ?History of Present Illness: ?Patient is a 66 year old Male. ? ?66 year old male, history of hypertension and diabetes, who was last known well four to five days ago when he noted new onset numbness and soreness on the right side of his body involving his face, arm, and leg. He denies any motor involvement and he is able to go about his day normally. 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 He comes to the ER today for evaluation. NIH Stroke Scale is 1 for right-sided sensory deficit. No history of stroke in the past. Of note he is prescribed aspirin but he does not take it consistently. ? ? ?Past Medical History: ?     Hypertension ?     Diabetes Mellitus ? ?No Anticoagulant use  ?No Antiplatelet use ?Reviewed EMR for current medications ? ?Allergies:  ?Reviewed ? ?Social  History: ?Drug Use: No ? ?Family History: ?There is no family history of premature cerebrovascular disease pertinent to this consultation ? ?ROS : ?14 Points Review of Systems was performed and was negative except mentioned in HPI. ? ?Past Surgical History: ?There Is No Surgical History Contributory To Today?s Visit ? ? ?Examination: ?1A: Level of Consciousness - Alert; keenly responsive + 0 ?1B: Ask Month and Age - Both Questions Right + 0 ?1C: Blink Eyes & Squeeze Hands - Performs Both Tasks + 0 ?2: Test Horizontal Extraocular Movements - Normal + 0 ?3: Test Visual Fields - No Visual Loss + 0 ?4: Test Facial Palsy (Use Grimace if Obtunded) - Normal symmetry + 0 ?5A: Test Left Arm Motor Drift - No Drift for 10 Seconds + 0 ?5B: Test Right Arm Motor Drift - No Drift for 10 Seconds + 0 ?6A: Test Left Leg Motor Drift - No Drift for 5 Seconds + 0 ?6B: Test Right Leg Motor Drift - No Drift for 5 Seconds + 0 ?7: Test Limb Ataxia (FNF/Heel-Shin) - No Ataxia + 0 ?8: Test Sensation - Mild-Moderate Loss: Less Sharp/More Dull + 1 ?9: Test Language/Aphasia - Normal; No aphasia + 0 ?10: Test Dysarthria - Normal + 0 ?11: Test Extinction/Inattention - No abnormality + 0 ? ?NIHSS Score: 1 ? ? ?Patient / Family was informed the Neurology Consult would occur via TeleHealth consult by way of interactive audio and video telecommunications and consented to receiving care in this manner. ? ?Patient is being evaluated for possible acute neurologic impairment and high probability of imminent or life - threatening  deterioration.I spent total of 20 minutes providing care to this patient, including time for face to face visit via telemedicine, review of medical records, imaging studies and discussion of findings with providers, the patient and / or family. ? ? ?Dr Burtis Junes ? ? ?[facility] ?[phone] ? ?Case [id number] ? ?

## 2022-02-25 ENCOUNTER — Observation Stay (HOSPITAL_COMMUNITY): Payer: Medicare (Managed Care)

## 2022-02-25 ENCOUNTER — Observation Stay (HOSPITAL_BASED_OUTPATIENT_CLINIC_OR_DEPARTMENT_OTHER): Payer: Medicare (Managed Care)

## 2022-02-25 ENCOUNTER — Encounter (HOSPITAL_COMMUNITY): Payer: Self-pay | Admitting: Internal Medicine

## 2022-02-25 DIAGNOSIS — I6389 Other cerebral infarction: Secondary | ICD-10-CM

## 2022-02-25 DIAGNOSIS — I6381 Other cerebral infarction due to occlusion or stenosis of small artery: Secondary | ICD-10-CM

## 2022-02-25 DIAGNOSIS — I10 Essential (primary) hypertension: Secondary | ICD-10-CM | POA: Diagnosis not present

## 2022-02-25 DIAGNOSIS — I8291 Chronic embolism and thrombosis of unspecified vein: Secondary | ICD-10-CM | POA: Diagnosis not present

## 2022-02-25 DIAGNOSIS — E1169 Type 2 diabetes mellitus with other specified complication: Secondary | ICD-10-CM | POA: Diagnosis not present

## 2022-02-25 DIAGNOSIS — F17210 Nicotine dependence, cigarettes, uncomplicated: Secondary | ICD-10-CM | POA: Diagnosis not present

## 2022-02-25 DIAGNOSIS — Z79899 Other long term (current) drug therapy: Secondary | ICD-10-CM | POA: Diagnosis not present

## 2022-02-25 DIAGNOSIS — Z7982 Long term (current) use of aspirin: Secondary | ICD-10-CM | POA: Diagnosis not present

## 2022-02-25 DIAGNOSIS — Z7984 Long term (current) use of oral hypoglycemic drugs: Secondary | ICD-10-CM | POA: Diagnosis not present

## 2022-02-25 DIAGNOSIS — E785 Hyperlipidemia, unspecified: Secondary | ICD-10-CM | POA: Diagnosis not present

## 2022-02-25 DIAGNOSIS — R200 Anesthesia of skin: Secondary | ICD-10-CM | POA: Diagnosis present

## 2022-02-25 DIAGNOSIS — C07 Malignant neoplasm of parotid gland: Secondary | ICD-10-CM | POA: Diagnosis not present

## 2022-02-25 DIAGNOSIS — I8290 Acute embolism and thrombosis of unspecified vein: Secondary | ICD-10-CM | POA: Diagnosis present

## 2022-02-25 DIAGNOSIS — M79604 Pain in right leg: Secondary | ICD-10-CM | POA: Diagnosis not present

## 2022-02-25 DIAGNOSIS — I639 Cerebral infarction, unspecified: Secondary | ICD-10-CM | POA: Diagnosis not present

## 2022-02-25 DIAGNOSIS — M4807 Spinal stenosis, lumbosacral region: Secondary | ICD-10-CM | POA: Diagnosis not present

## 2022-02-25 LAB — ECHOCARDIOGRAM COMPLETE
Area-P 1/2: 3.42 cm2
Height: 67 in
S' Lateral: 2.3 cm
Weight: 2272 oz

## 2022-02-25 LAB — HIV ANTIBODY (ROUTINE TESTING W REFLEX): HIV Screen 4th Generation wRfx: NONREACTIVE

## 2022-02-25 LAB — GLUCOSE, CAPILLARY
Glucose-Capillary: 238 mg/dL — ABNORMAL HIGH (ref 70–99)
Glucose-Capillary: 98 mg/dL (ref 70–99)

## 2022-02-25 MED ORDER — INSULIN ASPART 100 UNIT/ML IJ SOLN
0.0000 [IU] | Freq: Three times a day (TID) | INTRAMUSCULAR | Status: DC
  Administered 2022-02-25: 5 [IU] via SUBCUTANEOUS

## 2022-02-25 MED ORDER — ACETAMINOPHEN 160 MG/5ML PO SOLN: 650.0000 mg | ORAL | Status: DC | PRN

## 2022-02-25 MED ORDER — CLOPIDOGREL BISULFATE 75 MG PO TABS: 75.0000 mg | ORAL_TABLET | Freq: Every day | ORAL | 0 refills | Status: DC

## 2022-02-25 MED ORDER — SODIUM CHLORIDE 0.9 % IV SOLN: INTRAVENOUS | Status: DC

## 2022-02-25 MED ORDER — INSULIN ASPART 100 UNIT/ML IJ SOLN: 0.0000 [IU] | Freq: Every day | INTRAMUSCULAR | Status: DC

## 2022-02-25 MED ORDER — ASPIRIN EC 81 MG PO TBEC
81.0000 mg | DELAYED_RELEASE_TABLET | Freq: Every day | ORAL | Status: DC
  Administered 2022-02-25: 81 mg via ORAL
  Filled 2022-02-25: qty 1

## 2022-02-25 MED ORDER — STROKE: EARLY STAGES OF RECOVERY BOOK
Freq: Once | Status: AC
  Filled 2022-02-25: qty 1

## 2022-02-25 MED ORDER — ACETAMINOPHEN 325 MG PO TABS: 650.0000 mg | ORAL_TABLET | ORAL | Status: DC | PRN

## 2022-02-25 MED ORDER — ACETAMINOPHEN 650 MG RE SUPP: 650.0000 mg | RECTAL | Status: DC | PRN

## 2022-02-25 MED ORDER — IOHEXOL 350 MG/ML SOLN
75.0000 mL | Freq: Once | INTRAVENOUS | Status: AC | PRN
  Administered 2022-02-25: 75 mL via INTRAVENOUS

## 2022-02-25 MED ORDER — LISINOPRIL 2.5 MG PO TABS
5.0000 mg | ORAL_TABLET | Freq: Every day | ORAL | Status: DC
  Administered 2022-02-25: 5 mg via ORAL
  Filled 2022-02-25: qty 2

## 2022-02-25 MED ORDER — ATORVASTATIN CALCIUM 40 MG PO TABS
40.0000 mg | ORAL_TABLET | Freq: Every day | ORAL | Status: DC
  Administered 2022-02-25: 40 mg via ORAL
  Filled 2022-02-25: qty 1

## 2022-02-25 MED ORDER — ENOXAPARIN SODIUM 40 MG/0.4ML IJ SOSY
40.0000 mg | PREFILLED_SYRINGE | INTRAMUSCULAR | Status: DC
  Administered 2022-02-25: 40 mg via SUBCUTANEOUS
  Filled 2022-02-25: qty 0.4

## 2022-02-25 MED ORDER — ASPIRIN 81 MG PO TBEC
81.0000 mg | DELAYED_RELEASE_TABLET | Freq: Every day | ORAL | 0 refills | Status: AC
Start: 2022-02-25 — End: 2022-03-11

## 2022-02-25 MED ORDER — ATORVASTATIN CALCIUM 80 MG PO TABS: 80.0000 mg | ORAL_TABLET | Freq: Every day | ORAL | 0 refills | Status: DC

## 2022-02-25 MED ORDER — SENNOSIDES-DOCUSATE SODIUM 8.6-50 MG PO TABS: 1.0000 | ORAL_TABLET | Freq: Every evening | ORAL | Status: DC | PRN

## 2022-02-25 NOTE — Discharge Instructions (Signed)
Follow up: Please make an appointment to see your primary physician for follow up within 7 days of hospital discharge.  At that appointment: ? -We routinely change or add medications that can affect your baseline labs and fluid status; therefore, you may require repeat blood work or tests during your next visit with your PCP.  Your PCP may decide not to get them or may add new tests based on their clinical decision. ? -Please get all medicines reviewed and adjusted. ? -Please request that your primary physician go over all hospital tests and procedure/radiological results at the follow up.  Please get all hospital records sent to your physician by signing a hospital release before you go home. ? ?Activity: As tolerated with fall precautions; use walker/cane & assistance as needed. ? ?Disposition: Home   ? ?Diet:   Heart Healthy. ? ?For all patients - If you experience worsening of your admission symptoms or develop shortness of breath, life threatening emergency, suicidal or homicidal thoughts you must seek medical attention immediately by calling 911 or calling your MD immediately. ? ?Read complete instructions along with all the possible side effects for all the medicines you take and that have been prescribed to you. Take any new medicines after you have completely understood and accept all the possible adverse reactions/side effects.  ? ?Do not drive, operate heavy machinery, perform activities at heights, swimming or participation in water activities or provide baby sitting services until you have seen by Primary MD/Neurologist and advised to do so. ? ?Do not drive when taking pain medications.  ?  ?Do not take more than prescribed pain, sleep and anxiety medications. ? ?Special Instructions: If you have smoked or chewed Tobacco  in the last 2 yrs please stop smoking; also stop any regular Alcohol and/or any Recreational drug use including marijuana. ? ?Wear Seat belts while driving. ?  ?Please note:  You were  cared for by a hospitalist during your hospital stay. If you have any questions about your discharge medications or the care you received while you were in the hospital, you can call the unit and asked to speak with the hospitalist on call. Once you are discharged, your primary care physician will handle any further medical issues. Please note that NO REFILLS for any discharge medications will be authorized, as it is imperative that you return to your primary care physician (or establish a relationship with a primary care physician if you do not have one) for your aftercare needs so that they can reassess your need for medications and monitor your lab values.  ?

## 2022-02-25 NOTE — ED Notes (Addendum)
[facility] Transport Team at bedside 

## 2022-02-25 NOTE — Progress Notes (Signed)
?  Echocardiogram ?2D Echocardiogram has been performed. ? ?Gary Gay ?02/25/2022, 4:36 PM ?

## 2022-02-25 NOTE — Evaluation (Signed)
Occupational Therapy Evaluation ?Patient Details ?Name: Gary Gay ?MRN: [id number] ?DOB: May 12, 1956 ?Today's Date: 02/25/2022 ? ? ?History of Present Illness Gary Gay is a 66 y.o. male with past medical history of uncontrolled DM and HTN who presents to [facility] for a 5 day history of swelling, pain and numbness to his right leg, as well as numbness to his right arm, neck and right side of his face. Symptoms started last Wednesday and have not resolved so he decided to get evaluated. He also endorses weak right hand grip in which he was dropping things from his hand and leg weakness as it would sometimes drag. CT head revealed Small, age-indeterminate left-sided para thalamic infarct.  ? ?Clinical Impression ?  ?Pt is currently modified independent to independent for selfcare tasks and  functional mobility.  He was able to ambulate in the hallway and negotiate 3 steps and step over into the shower tub in the gym with modified independence.  He does exhibit some slight coordination impairment in the RUE noted with tying his gown and with his handwriting not being back to baseline, but close.  Higher level balance deficits are also present but only minimal as he scored a 52/56 on the Berg indicating no need for an assistive device.  No follow-up OT recommended post acute.  Pt was educated on BEFAST and provided with coordination exercises to complete at home.  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  He did not demonstrate any cognitive or perceptual deficits, but with his slight balance impairment and coordination deficits, I have advised him that this would not be safe immediately.  ?   ? ?Recommendations for follow up therapy are one component of a multi-disciplinary discharge planning process, led by the attending physician.  Recommendations may be updated based on patient status, additional functional criteria and insurance authorization.  ? ?Follow Up Recommendations ? No OT follow  up  ?  ?Assistance Recommended at Discharge Intermittent Supervision/Assistance  ?Patient can return home with the following Assist for transportation ? ?  ?Functional Status Assessment ? Patient has had a recent decline in their functional status and demonstrates the ability to make significant improvements in function in a reasonable and predictable amount of time.  ?Equipment Recommendations ? None recommended by OT  ?  ?Recommendations for Other Services   ? ? ?  ?Precautions / Restrictions Precautions ?Precautions: None ?Restrictions ?Weight Bearing Restrictions: No  ? ?  ? ?Mobility Bed Mobility ?Overal bed mobility: Independent ?  ?  ?  ?  ?  ?  ?  ?  ? ?Transfers ?Overall transfer level: Independent ?Equipment used: None ?  ?  ?  ?  ?  ?  ?  ?General transfer comment: Pt able to ambulate up and down the hallway without LOB with head turns left and right as well as completing up and down three stairs without use of the handrails, even though he has them. ?  ? ?  ?Balance   ?  ?Sitting balance-Leahy Scale: Normal ?  ?  ?Standing balance support: During functional activity ?Standing balance-Leahy Scale: Good ?  ?  ?  ?  ?  ?  ?  ?  ?  ?Standardized Balance Assessment ?Standardized Balance Assessment : Berg Balance Test, Dynamic Gait Index ?Berg Balance Test ?Sit to Stand: Able to stand without using hands and stabilize independently ?Standing Unsupported: Able to stand safely 2 minutes ?Sitting with Back Unsupported but Feet Supported on  Floor or Stool: Able to sit safely and securely 2 minutes ?Stand to Sit: Sits safely with minimal use of hands ?Transfers: Able to transfer safely, minor use of hands ?Standing Unsupported with Eyes Closed: Able to stand 10 seconds safely ?Standing Ubsupported with Feet Together: Able to place feet together independently and stand 1 minute safely ?From Standing, Reach Forward with Outstretched Arm: Can reach confidently >25 cm (10") ?From Standing Position, Pick up Object from  Floor: Able to pick up shoe safely and easily ?From Standing Position, Turn to Look Behind Over each Shoulder: Looks behind from both sides and weight shifts well ?Turn 360 Degrees: Able to turn 360 degrees safely in 4 seconds or less ?Standing Unsupported, Alternately Place Feet on Step/Stool: Able to stand independently and complete 8 steps >20 seconds ?Standing Unsupported, One Foot in Front: Able to take small step independently and hold 30 seconds ?Standing on One Leg: Able to lift leg independently and hold 5-10 seconds ?Total Score: 52 ?  ?   ? ?ADL either performed or assessed with clinical judgement  ? ?ADL Overall ADL's : Modified independent ?  ?  ?  ?  ?  ?  ?  ?  ?  ?  ?  ?  ?  ?  ?  ?  ?  ?  ?  ?General ADL Comments: Pt is able to complete simulated selfcare tasks at modified independent level overall.  Increased time needed for fastening or buttoning but otherwise he is able to complete.  Modifed independent for simulated shower/tub transfers as well.  ? ? ? ?Vision Baseline Vision/History: 0 No visual deficits ?Ability to See in Adequate Light: 0 Adequate ?Patient Visual Report: No change from baseline ?Vision Assessment?: Yes ?Eye Alignment: Within Functional Limits ?Ocular Range of Motion: Within Functional Limits ?Alignment/Gaze Preference: Within Defined Limits ?Tracking/Visual Pursuits: Able to track stimulus in all quads without difficulty ?Saccades: Within functional limits ?Convergence: Within functional limits ?Visual Fields: No apparent deficits  ?   ?Perception Perception ?Perception: Within Functional Limits ?  ?Praxis Praxis ?Praxis: Intact ?  ? ?Pertinent Vitals/Pain Pain Assessment ?Pain Assessment: No/denies pain  ? ? ? ?Hand Dominance Right ?  ?Extremity/Trunk Assessment Upper Extremity Assessment ?Upper Extremity Assessment: RUE deficits/detail ?RUE Deficits / Details: Slight slower speed with finger to nose compared to the left and increased time needed for tying his gowns.   Strength overall 5/5 throughout ?RUE Sensation:  (slight numbness in the right arm and hand, but able to detect light touch throughout.  Proprioception also intact.) ?RUE Coordination: decreased fine motor;decreased gross motor ?  ?Lower Extremity Assessment ?Lower Extremity Assessment: Overall WFL for tasks assessed ?  ?Cervical / Trunk Assessment ?Cervical / Trunk Assessment: Normal ?  ?Communication Communication ?Communication: No difficulties ?  ?Cognition Arousal/Alertness: Awake/alert ?Behavior During Therapy: [facility] for tasks assessed/performed ?Overall Cognitive Status: Within Functional Limits for tasks assessed ?  ?  ?  ?  ?  ?  ?  ?  ?  ?  ?  ?  ?  ?  ?  ?  ?  ?  ?  ?   ?   ?   ? ? ?Home Living Family/patient expects to be discharged to:: Private residence ?Living Arrangements: Alone ?Available Help at Discharge: Available PRN/intermittently (sister can help out, lives down the street) ?Type of Home: House ?Home Access: Stairs to enter ?Entrance Stairs-Number of Steps: 3 ?Entrance Stairs-Rails: Left;Can reach both ?Home Layout: One level ?  ?  ?Bathroom Shower/Tub:  Tub/shower unit ?  ?Bathroom Toilet: Handicapped height ?Bathroom Accessibility: Yes ?How Accessible: Accessible via walker ?Home Equipment: None ?  ?  ?  ? ?  ?Prior Functioning/Environment Prior Level of Function : Independent/Modified Independent ?  ?  ?  ?  ?  ?  ?  ?  ?  ? ?  ?  ?OT Problem List: Decreased coordination;Impaired balance (sitting and/or standing);Impaired UE functional use ?  ?   ?OT Treatment/Interventions: Therapeutic exercise;Therapeutic activities;Balance training;Patient/family education;Self-care/ADL training  ?  ?OT Goals(Current goals can be found in the care plan section) Acute Rehab OT Goals ?Patient Stated Goal: Pt wants to get back to work ?OT Goal Formulation: With patient ?Time For Goal Achievement: 03/04/22 ?Potential to Achieve Goals: Good  ?OT Frequency: Min 2X/week ?  ? ?   ?AM-PAC OT "6 Clicks" Daily  Activity     ?Outcome Measure Help from another person eating meals?: None ?Help from another person taking care of personal grooming?: None ?Help from another person toileting, which includes using toliet, bedpan

## 2022-02-25 NOTE — ED Notes (Signed)
Phone Handoff Report provided to [employer], spoke with Paul-RN ?

## 2022-02-25 NOTE — ED Notes (Signed)
Resting quietly, client states the decreased sensation in his rt arm and rt leg have improved. A&O x 3, MAE x 4, am meal offered. Cont to await for room assignment ?

## 2022-02-25 NOTE — ED Notes (Signed)
Engineer, technical sales called to request tx of client from [facility] ED to [employer] ?

## 2022-02-25 NOTE — Discharge Summary (Signed)
?Physician Discharge Summary ?  ?Patient: Gary Gay MRN: [id number] DOB: 26-Dec-1955  ?Admit date:     02/24/2022  ?Discharge date: 02/25/22  ?Discharge Physician: Karmen Bongo  ? ?PCP: Charyl Dancer, NP  ? ?Recommendations at discharge:  ? ?Take aspirin plus Plavix for 2 weeks and then Plavix every day afterwards. ?No driving for the rest of the week AND until approved by your primary care doctor. ?You have a parotid gland mass and it needs to be followed up by your primary care doctor to make sure it is benign (not cancer). ?Your primary care doctor also needs to follow-up the echocardiogram result that was done today since you are being released before the result is available. ?There are some medication changes - make sure to pick up medications from the pharmacy and take as directed. ?Stop smoking! ? ?Discharge Diagnoses: ?Principal Problem: ?  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  Tobacco use ?  Diabetes mellitus without complication ([place]) ?  Essential hypertension ?  Dyslipidemia ?  Canalized thrombus ? ?CVA ?-Patient with approaching 1-week h/o R-sided facial droop and numbness along R face and RUE and body extending to thigh ?-Imaging at [facility] confirms acute L thalamic CVA ?-Aspirin has been given to reduce stroke mortality and decrease morbidity ?-Placed in observation status for further CVA evaluation ?-MRI confirms an acute lacunar infarct in the left thalamus ?-CTA head and neck shows 60% left ICA origin stenosis and severe right vertebral artery origin stenosis; recommend close outpatient monitoring. ?-Echo pending; needs PCP to review echo result. ?-Reports irregular but frequent ASA use. Will treat with 2 weeks of DAPT and then transition to Plavix monotherapy per neurology. ?-Neurology consulted ?-PT screened and did not appreciate acute needs; OT consulted and does not recommend acute PT/OT post-acute needs.  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  He is recommended to NOT drive for the remainder of this week AND until approved by PCP to return to work. ?-Will discharge to home ?  ?HTN ?-He is out of the window for permissive HTN  ?-Will resume lisinopril ?  ?HLD ?-Recent FLP with LDL 107, goal is <70 ?-Will start Lipitor 80 mg daily ?  ?DM ?-Recent A1c shows poor control ?-Resume home PO medications (metformin, Jardiance)  ?  ?Canalized thrombus ?-Does not appear to be acute or need intervention at this time ?  ?Tobacco dependence ?-Encourage cessation.   ?-This was discussed with the patient and should be reviewed on an ongoing basis.   ?-Patch declined by patient. ? 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 ?Spinal stenosis ?-MRI was performed by EDP and showed moderate stenosis at L4-5 and L5-S1 ?-If he develops persistent back pain and/or radicular symptoms, he will need neurosurgical consultation ? ? ? ? ?Consultants: Neurology; OT ?Procedures performed: MRI; CTA head and neck  ?Disposition: Home ?Diet recommendation:  ?Cardiac diet ?DISCHARGE MEDICATION: ?Allergies as of 02/25/2022   ?No Known Allergies ?  ? ?  ?Medication List  ?  ? ?TAKE these medications   ? ?acetaminophen 325 MG tablet ?Commonly known as: TYLENOL ?Take 650 mg by mouth every 6 (six) hours as needed for mild pain. ?  ?aspirin 81 MG EC tablet ?Take 1 tablet (81 mg total) by mouth daily for 14 days. ?  ?atorvastatin 80 MG tablet ?Commonly known as: Lipitor ?Take 1 tablet (80  mg total) by mouth daily. ?  ?clopidogrel 75 MG tablet ?Commonly known as: Plavix ?Take 1 tablet (75 mg total) by mouth daily. ?  ?empagliflozin 10 MG Tabs tablet ?Commonly known as: Jardiance ?Take 1 tablet (10 mg total) by mouth daily before breakfast. ?  ?lisinopril 5 MG tablet ?Commonly known as: ZESTRIL ?Take 1 tablet (5 mg total) by mouth daily. ?  ?metFORMIN 500 MG 24 hr tablet ?Commonly  known as: GLUCOPHAGE-XR ?Take 2 tablets (1,000 mg total) by mouth daily with breakfast. ?  ? ?  ? ? ?Discharge Exam: ?[name] Weights  ? 02/24/22 1711  ?Weight: 64.4 kg  ? ? ? ?Condition at discharge: stable ? ?The results of significant diagnostics from this hospitalization (including imaging, microbiology, ancillary and laboratory) are listed below for reference.  ? ?Imaging Studies: ?CT ANGIO HEAD NECK W WO CM ? ?Result Date: 02/25/2022 ?CLINICAL DATA:  Stroke follow-up. Acute left thalamic infarct on MRI. EXAM: CT ANGIOGRAPHY HEAD AND NECK TECHNIQUE: Multidetector CT imaging of the head and neck was performed using the standard protocol during bolus administration of intravenous contrast. Multiplanar CT image reconstructions and MIPs were obtained to evaluate the vascular anatomy. Carotid stenosis measurements (when applicable) are obtained utilizing NASCET criteria, using the distal internal carotid diameter as the denominator. RADIATION DOSE REDUCTION: This exam was performed according to the departmental dose-optimization program which includes automated exposure control, adjustment of the mA and/or kV according to patient size and/or use of iterative reconstruction technique. CONTRAST:  25m OMNIPAQUE IOHEXOL 350 MG/ML SOLN COMPARISON:  Head MRI 02/25/2022. Cervical spine CT 06/09/2012. No prior angiographic imaging. FINDINGS: CT HEAD FINDINGS Brain: Has shown on the recent MRI, there is an acute lacunar infarct in the lateral aspect of the left thalamus. No intracranial hemorrhage, mass, midline shift, or extra-axial fluid collection is identified. The ventricles and sulci are within normal limits for age. Mild nonspecific gliosis is again noted in the white matter of the right greater than left frontal lobes. Vascular: Reported below. Skull: No fracture or suspicious osseous lesion. Sinuses: Paranasal sinuses and mastoid air cells are clear. Orbits: Unremarkable. Review of the MIP images confirms the above  findings CTA NECK FINDINGS Aortic arch: Normal variant 4 vessel aortic arch with the left vertebral artery arising directly from the arch. Less than 50% stenosis both proximal subclavian arteries due to soft plaque. Right carotid system: Patent with a small amount of predominantly soft plaque at the carotid bifurcation. No evidence of a significant stenosis or dissection. Tortuous distal cervical ICA. Left carotid system: Patent with a small amount of soft plaque in the common carotid artery not resulting in significant stenosis. Soft plaque at the carotid bifurcation results in 60% stenosis of the ICA origin. Tortuous distal cervical ICA. Vertebral arteries: The vertebral arteries are patent and codominant without evidence of a dissection or a significant stenosis on the left. There is a severe stenosis of the right vertebral origin. Skeleton: Mild cervical spondylosis. Other neck: 2.0 x 1.4 cm enhancing mass posteriorly in the right parotid gland (1.3 cm in 2013). No evidence of cervical lymphadenopathy. Upper chest: Clear lung apices. Review of the MIP images confirms the above findings CTA HEAD FINDINGS Anterior circulation: The internal carotid arteries are widely patent from skull base to carotid termini. ACAs and MCAs are patent without evidence of a proximal branch occlusion or significant proximal stenosis. No aneurysm is identified. Posterior circulation: The intracranial vertebral arteries are widely patent to the basilar. Patent PICA, AICA, and SCA origins  are seen bilaterally. The basilar artery is widely patent. There are moderate right and small left posterior communicating arteries. Both PCAs are patent, and there are mild stenoses of the proximal left and mid right P2 segments. No aneurysm is identified. Venous sinuses: Patent. Anatomic variants: None. Review of the MIP images confirms the above findings IMPRESSION: 1. 60% left ICA origin stenosis. 2. Severe right vertebral artery origin stenosis.  3. No major intracranial arterial occlusion or significant proximal stenosis. 4. 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 ENT referral is recommended. Electronically Signed   B

## 2022-02-25 NOTE — H&P (Signed)
?History and Physical  ? ? ?Patient: Gary Gay ZOX:[id number] DOB: 1956-02-03 ?DOA: 02/24/2022 ?DOS: the patient was seen and examined on 02/25/2022 ?PCP: Charyl Dancer, NP  ?Patient coming from: Home - lives alone; NOK: Sister, [phone] ? ? ?Chief Complaint: R arm numbness ? ?HPI: Gary Gay is a 66 y.o. male with medical history significant of HTN and DM presenting with R arm numbness.  He reports that he had right-soreness and numbness starting from his neck to his thigh.  Symptoms were noticed last Wednesday.  It wasn't getting any better and so he decided to come to the ER.  No dysphagia or dysarthria.  He was weak in the arm as well.  No prior h/o CVA.  He denies h/o HTN but had prior rx for Lisinopril that he has not been taking. ? ? ? ?ER Course:  [facility] to [facility] transfer, per Dr. Bridgett Larsson: ? ?66 yo with hx of DM, with 5-6 days of numbness to right arm and hand. CT head shows left thalamic CVA. elevated BP in ER of 182/87. since symptoms have been present for over 5 days now, asked EDP to contact neurology about pt's HTN and whether to treat it or not.  ? ? ? ? ?Review of Systems: As mentioned in the history of present illness. All other systems reviewed and are negative. ?Past Medical History:  ?Diagnosis Date  ? Diabetes mellitus   ? ?Past Surgical History:  ?Procedure Laterality Date  ? SHOULDER SURGERY Right   ? ?Social History:  reports that he has been smoking cigarettes. He has a 24.50 pack-year smoking history. He has never used smokeless tobacco. He reports that he does not currently use alcohol. He reports that he does not use drugs. ? ?No Known Allergies ? ?Family History  ?Problem Relation Age of Onset  ? Cancer Sister   ?     pancreatic  ? Diabetes Maternal Grandmother   ? Stroke Neg Hx   ? ? ?Prior to Admission medications   ?Medication Sig Start Date End Date Taking? Authorizing Provider  ?aspirin 81 MG EC tablet TAKE ONE TABLET BY MOUTH AS DIRECTED BY YOUR MEDICAL PROVIDER 01/28/20   [provider]  ?empagliflozin (JARDIANCE) 10 MG TABS tablet Take 1 tablet (10 mg total) by mouth daily before breakfast. 01/21/22   McElwee, Lauren A, NP  ?lisinopril (ZESTRIL) 5 MG tablet Take 1 tablet (5 mg total) by mouth daily. 01/21/22   McElwee, Scheryl Darter, NP  ?metFORMIN (GLUCOPHAGE-XR) 500 MG 24 hr tablet Take 2 tablets (1,000 mg total) by mouth daily with breakfast. 01/21/22   McElwee, Scheryl Darter, NP  ? ? ?Physical Exam: ?Vitals:  ? 02/25/22 0630 02/25/22 0709 02/25/22 0724 02/25/22 0919  ?BP: (!) 171/88 (!) 172/88 (!) 174/85 (!) 171/82  ?Pulse: 86 78 80 78  ?Resp: '16 18 18 18  '$ ?Temp:   98.4 ?F (36.9 ?C) 98.2 ?F (36.8 ?C)  ?TempSrc:   Oral Oral  ?SpO2: 99% 100% 100% 100%  ?Weight:      ?Height:      ? ?General:  Appears calm and comfortable and is in NAD ?Eyes:  EOMI, normal lids, iris ?ENT:  grossly normal hearing, lips & tongue, mmm; poor/absent dentition ?Neck:  no LAD, masses or thyromegaly ?Cardiovascular:  RRR, no m/r/g. No LE edema.  ?Respiratory:   CTA bilaterally with no wheezes/rales/rhonchi.  Normal respiratory effort. ?Abdomen:  soft, NT, ND ?Skin:  no rash or induration seen on limited exam ?Musculoskeletal:  grossly  normal tone BUE/BLE, good ROM, no bony abnormality ?Psychiatric:  grossly normal mood and affect, speech fluent and appropriate, AOx3 ?Neurologic:  Mild R facial droop, moves all extremities in coordinated fashion, sensation diminished along R face and UE ? ? ?Radiological Exams on Admission: ?Independently reviewed - see discussion in A/P where applicable ? ?CT HEAD WO CONTRAST ? ?Result Date: 02/24/2022 ?CLINICAL DATA:  Swelling to the right-side of the body with right arm and leg pain. EXAM: CT HEAD WITHOUT CONTRAST TECHNIQUE: Contiguous axial images were obtained from the base of the skull through the vertex without intravenous contrast. RADIATION DOSE REDUCTION: This exam was performed according to the departmental dose-optimization program which includes automated exposure control,  adjustment of the mA and/or kV according to patient size and/or use of iterative reconstruction technique. COMPARISON:  None. FINDINGS: Brain: There is mild cerebral atrophy with widening of the extra-axial spaces and ventricular dilatation. There are areas of decreased attenuation within the white matter tracts of the supratentorial brain, consistent with microvascular disease changes. A 10 mm x 4 mm left-sided para thalamic infarct (approximately 23.14 Hounsfield units) of indeterminate age is seen. There is no evidence of mass effect or midline shift. Vascular: No hyperdense vessel or unexpected calcification. Skull: Normal. Negative for fracture or focal lesion. Sinuses/Orbits: No acute finding. Other: None. IMPRESSION: 1. 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 Correlation with MRI is recommended. 2. Generalized cerebral atrophy with mild, chronic white matter small vessel ischemic changes. Electronically Signed   By: Virgina Norfolk M.D.   On: 02/24/2022 18:42  ? ?US Venous Img Lower Unilateral Right ? ?Result Date: 02/24/2022 ?CLINICAL DATA:  Right lower extremity leg pain and swelling EXAM: RIGHT LOWER EXTREMITY VENOUS DOPPLER ULTRASOUND TECHNIQUE: Gray-scale sonography with graded compression, as well as color Doppler and duplex ultrasound were performed to evaluate the lower extremity deep venous systems from the level of the common femoral vein and including the common femoral, femoral, profunda femoral, popliteal and calf veins including the posterior tibial, peroneal and gastrocnemius veins when visible. The superficial great saphenous vein was also interrogated. Spectral Doppler was utilized to evaluate flow at rest and with distal augmentation maneuvers in the common femoral, femoral and popliteal veins. COMPARISON:  None. FINDINGS: Contralateral Common Femoral Vein: Respiratory phasicity is normal and symmetric with the symptomatic side. No evidence of thrombus. Normal  compressibility. Common Femoral Vein: Thrombus is noted with decreased compressibility. Saphenofemoral Junction: Thrombus is noted with decreased compressibility. Profunda Femoral Vein: Thrombus is noted with decreased compressibility. Femoral Vein: Thrombus is noted with decreased compressibility. Popliteal Vein: Thrombus is noted with decreased compressibility. Calf Veins: Thrombus is noted with decreased compressibility. Superficial Great Saphenous Vein: No evidence of thrombus. Normal compressibility. Venous Reflux:  None. Other Findings:  None. IMPRESSION: Thrombus is noted throughout the right lower extremity although it is predominately eccentric within the vein likely related to chronic recanalized thrombus. No occlusive thrombus is seen. Electronically Signed   By: Inez Catalina M.D.   On: 02/24/2022 20:02   ? ?EKG: Independently reviewed.  NSR with rate 79; nonspecific ST changes, likely early repolarization ? ? ?Labs on Admission: I have personally reviewed the available labs and imaging studies at the time of the admission. ? ?Pertinent labs:   ? ?Glucose 161 ?WBC 6.3 ?Hgb 11.8 - stable ?INR 1.0 ?A1c 12.8 on 3/23 ?Lipids on 3/23: 169/40/107/108 ? ? ?Assessment and Plan: ?Principal Problem: ?  Thalamic stroke ([place]) ?Active Problems: ?  Tobacco use ?  Diabetes mellitus without complication (  [place]) ?  Essential hypertension ?  Dyslipidemia ?  Canalized thrombus ?  ? ?CVA ?-Patient with approaching 1-week h/o R-sided facial droop and numbness along R face and RUE and body extending to thigh ?-Imaging at [facility] confirms acute L thalamic CVA ?-Aspirin has been given to reduce stroke mortality and decrease morbidity ?-Will place in observation status for further CVA evaluation ?-Telemetry monitoring ?-MRI ?-CTA head and neck ?-Echo ?-If the patient does not have known afib and this is not detected on telemetry during hospitalization, consider outpatient Holter monitoring and/or loop recorder placement. ?-Will check  UDS ?-He has not failed ASA therapy since he was not taking it regularly.  He is likely to need 3 weeks of DAPT and then transition to ASA monotherapy. ?-Neurology consult ?-PT/OT/ST/Nutrition Consults ?-Anticipat

## 2022-02-25 NOTE — ED Notes (Signed)
Phone Report provided to rec RN at [facility] main campus ?

## 2022-02-25 NOTE — Consult Note (Addendum)
Neurology Consultation ? ?Reason for Consult: left thalamic infarct  ?Referring Physician: Dr. Lorin Mercy  ? ?CC: numbness to right side  ? ?History is obtained from:patient and medical record  ? ?HPI: Gary Gay is a 66 y.o. male with past medical history of uncontrolled DM and HTN who presents to [facility] for a 5 day history of swelling, pain and numbness to his right leg, as well as numbness to his right arm, neck and right side of his face. Symptoms started last Wednesday and have not resolved so he decided to get evaluated. He also endorses weak right hand grip in which he was dropping things from his hand and leg weakness as it would sometimes drag. CT head revealed Small, age-indeterminate left-sided para thalamic infarct. He was transferred her for a stroke workup.  ? ? ?LKW: 4/29/20232 ?tpa given?: no, outside window  ?Premorbid modified Rankin scale (mRS):  ?0-Completely asymptomatic and back to baseline post-stroke ? ? ?ROS: Full ROS was performed and is negative except as noted in the HPI.  ? ?Past Medical History:  ?Diagnosis Date  ? Diabetes mellitus   ? ? ? ?Essential (primary) hypertension and Type 2 diabetes mellitus with hyperglycemia   ? ?Family History  ?Problem Relation Age of Onset  ? Cancer Sister   ?     pancreatic  ? Diabetes Maternal Grandmother   ? Stroke Neg Hx   ? ? ? ?Social History:  ? reports that he has been smoking cigarettes. He has a 24.50 pack-year smoking history. He has never used smokeless tobacco. He reports that he does not currently use alcohol. He reports that he does not use drugs. ?Medications ? ?Current Facility-Administered Medications:  ?   stroke: early stages of recovery book, , Does not apply, Once, Karmen Bongo, MD ?  0.9 %  sodium chloride infusion, , Intravenous, Continuous, Karmen Bongo, MD ?  acetaminophen (TYLENOL) tablet 650 mg, 650 mg, Oral, Q4H PRN **OR** acetaminophen (TYLENOL) 160 MG/5ML solution 650 mg, 650 mg, Per Tube, Q4H PRN **OR** acetaminophen  (TYLENOL) suppository 650 mg, 650 mg, Rectal, Q4H PRN, Karmen Bongo, MD ?  aspirin EC tablet 81 mg, 81 mg, Oral, Daily, Karmen Bongo, MD ?  atorvastatin (LIPITOR) tablet 40 mg, 40 mg, Oral, Daily, Karmen Bongo, MD ?  enoxaparin (LOVENOX) injection 40 mg, 40 mg, Subcutaneous, Q24H, Karmen Bongo, MD ?  insulin aspart (novoLOG) injection 0-15 Units, 0-15 Units, Subcutaneous, TID WC, Karmen Bongo, MD ?  insulin aspart (novoLOG) injection 0-5 Units, 0-5 Units, Subcutaneous, QHS, Karmen Bongo, MD ?  lisinopril (ZESTRIL) tablet 5 mg, 5 mg, Oral, Daily, Karmen Bongo, MD ?  senna-docusate (Senokot-S) tablet 1 tablet, 1 tablet, Oral, QHS PRN, Karmen Bongo, MD ? ? ?Exam: ?Current vital signs: ?BP (!) 171/82 (BP Location: Left Arm)   Pulse 78   Temp 98.2 ?F (36.8 ?C) (Oral)   Resp 18   Ht '5\' 7"'$  (1.702 m)   Wt 64.4 kg   SpO2 100%   BMI 22.24 kg/m?  ?Vital signs in last 24 hours: ?Temp:  [98.2 ?F (36.8 ?C)-98.4 ?F (36.9 ?C)] 98.2 ?F (36.8 ?C) (05/02 0919) ?Pulse Rate:  [66-86] 78 (05/02 0919) ?Resp:  [14-22] 18 (05/02 0919) ?BP: (142-189)/(68-110) 171/82 (05/02 0919) ?SpO2:  [97 %-100 %] 100 % (05/02 0919) ?Weight:  [64.4 kg] 64.4 kg (05/01 1711) ? ?GENERAL: Awake, alert in NAD ?HEENT: - Normocephalic and atraumatic, dry mm ?LUNGS - Clear to auscultation bilaterally with no wheezes ?CV - S1S2 RRR, no m/r/g, equal  pulses bilaterally. ?ABDOMEN - Soft, nontender, nondistended with normoactive BS ?Ext: warm, well perfused, intact peripheral pulses, no edema ? ?NEURO:  ?Mental Status: AA&Ox4 ?Language: speech is clear.  Naming, repetition, fluency, and comprehension intact. ?Cranial Nerves: PERRL 2 mm/brisk. EOMI, visual fields full, no facial asymmetry, facial sensation intact, hearing intact, tongue/uvula/soft palate midline, norma sternocleidomastoid and trapezius muscle strength. No evidence of tongue atrophy or fibrillations ?Motor: Right arm 4/5, left arm 5/5, right leg 5/5, left leg 5/5 ?Tone: is  normal and bulk is normal ?Sensation- decreased on the right  ?Coordination: FTN intact bilaterally, no ataxia in BLE. ?Gait- deferred ? ?NIHSS ?1a Level of Conscious.: 0 ?1b LOC Questions: 0 ?1c LOC Commands: 0 ?2 Best Gaze: 0 ?3 Visual: 0 ?4 Facial Palsy: 0 ?5a Motor Arm - left: 0 ?5b Motor Arm - Right: 1 ?6a Motor Leg - Left: 0 ?6b Motor Leg - Right: 0 ?7 Limb Ataxia: 0 ?8 Sensory: 1 ?9 Best Language: 0 ?10 Dysarthria: 0 ?11 Extinct. and Inatten.: 0 ?TOTAL: 2  ? ?Labs ?I have reviewed labs in epic and the results pertinent to this consultation are: ? ?HGBA1c 12.8 (12/2021) ?LDL-c 107 (12/2021) ?Echo Pending  ? ?Imaging ?I have reviewed the images obtained: ? ?CT-head 5/1: ?1. 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 ?2. Generalized cerebral atrophy with mild, chronic white matter small vessel ischemic changes. ?  ?MRI examination of the brain 5/2 ?1. Acute lacunar infarct in the left thalamus. ?2. 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 ?Assessment:  ?Gary Gay is a 65 y.o. male with past medical history of uncontrolled DM and HTN who presents to [facility] for a 5 day history of swelling, pain and numbness to his right leg, as well as numbness to his right arm, neck and right side of his face. Symptoms started last Wednesday and have not resolved so he decided to get evaluated. ? 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 ?Recommendations: ?- Frequent neuro checks ?- Echocardiogram ordered  ?- CTA head and neck (already ordered) ?- Prophylactic therapy-Antiplatelet med: Aspirin - dose '81mg'$  and plavix '75mg'$  daily x 2 weeks followed by plavix monotherapy.   ?- increase lipitor to '80mg'$  daily.  ?- bedside swallow eval  ?- Risk factor modification ?- Telemetry monitoring ?- PT consult, OT consult, Speech consult ?- Stroke team to follow  ? ?Beulah Gandy DNP, ACNPC-AG  ? ?I have seen the patient reviewed the above note.  He does have some vertebral stenosis,  but based on the appearance of the stroke I suspect that this is likely small vessel disease.  He had a lipid panel ordered a little bit over a month ago which was 107, and has not had any change to his statin since that time.  I would favor being more aggressive with his statin therapy given his atherosclerotic cerebrovascular disease and stroke. ? ?His echo has been performed but is still pending, but given that I suspect this is small vessel disease in etiology, I do not feel strongly about keeping him in-house until it is returned as I doubt it would change management.  I can follow this up and contact him if there are any emergent findings. ? ?Roland Rack, MD ?[facility] ?[phone] ? ?If 7pm- 7am, please page neurology on call as listed in [place]. ? ?

## 2022-02-25 NOTE — Progress Notes (Signed)
PT Cancellation Note ? ?Patient Details ?Name: Hoa Deriso ?MRN: [id number] ?DOB: [birth year] ? ? ?Cancelled Treatment:    Reason Eval/Treat Not Completed: PT screened, no needs identified, will sign off; see OT Evaluation note for further details. Please reconsult if new needs arise. ? ?Mabeline Caras, PT, DPT ?Acute Rehabilitation Services  ?Pager [phone] ?Office [phone] ? ?Derry Lory ?02/25/2022, 3:56 PM ?

## 2022-03-02 NOTE — Progress Notes (Signed)
? ?Established Visit ? ?BP 116/81   Pulse 77   Temp (!) 97 ?F (36.1 ?C) (Temporal)   Wt 133 lb 6.4 oz (60.5 kg)   SpO2 99%   BMI 20.89 kg/m?   ? ?Subjective:  ? ? Patient ID: Gary Gay, male    DOB: 05/31/1956, 66 y.o.   MRN: [id number] ? ?CC: ?Chief Complaint  ?Patient presents with  ? Hospitalization Follow-up  ?  Pt is here for hosp f/u. Pt c/o swelling on rt side.   ? ? ?HPI: ?Gary Gay is a 66 y.o. male who presents today to follow-up on hospitalization after a stroke. ? ?He states that he started the [place] after last visit, and noticed that it started making him feel a little funny.  He states that he was having some numbness and tingling on his right arm and leg and went to the hospital where he was diagnosed with a stroke.  He states that since being discharged, he is having swelling on his right side, including his right arm, right side of his abdomen, and right leg.  He has since stopped the [place].  Overall he states that his sugars have been well controlled at home.  They have been ranging from 98-120s.  He has not had any headaches, chest pain, shortness of breath, weakness.  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 ? ?After discharge, he was started on aspirin 81 mg daily for 2 weeks, Plavix 75 mg daily, and atorvastatin 80 mg daily.  He has been tolerating these medications well without any side effects.  He currently does not have an appointment scheduled with neurology for follow-up. ? ?Past Medical History:  ?Diagnosis Date  ? Diabetes mellitus   ? ? ?Past Surgical History:  ?Procedure Laterality Date  ? SHOULDER SURGERY Right   ? ? ?Current Outpatient Medications on File Prior to Visit  ?Medication Sig Dispense Refill  ? acetaminophen (TYLENOL) 325 MG tablet Take 650 mg by mouth every 6 (six) hours as needed for mild pain.    ? aspirin 81 MG EC tablet Take 1 tablet (81 mg total) by mouth daily for 14 days. 14 tablet 0  ? atorvastatin (LIPITOR)  80 MG tablet Take 1 tablet (80 mg total) by mouth daily. 30 tablet 0  ? clopidogrel (PLAVIX) 75 MG tablet Take 1 tablet (75 mg total) by mouth daily. 30 tablet 0  ? lisinopril (ZESTRIL) 5 MG tablet Take 1 tablet (5 mg total) by mouth daily. (Patient not taking: Reported on 02/25/2022) 30 tablet 2  ? metFORMIN (GLUCOPHAGE-XR) 500 MG 24 hr tablet Take 2 tablets (1,000 mg total) by mouth daily with breakfast. 180 tablet 0  ? ?No current facility-administered medications on file prior to visit.  ?  ? ?Review of Systems ?See pertinent positives and negatives per HPI. ? ?   ?Objective:  ?  ?BP 116/81   Pulse 77   Temp (!) 97 ?F (36.1 ?C) (Temporal)   Wt 133 lb 6.4 oz (60.5 kg)   SpO2 99%   BMI 20.89 kg/m?   ?Wt Readings from Last 3 Encounters:  ?03/03/22 133 lb 6.4 oz (60.5 kg)  ?02/24/22 142 lb (64.4 kg)  ?01/21/22 135 lb 3.2 oz (61.3 kg)  ?  ?BP Readings from Last 3 Encounters:  ?03/03/22 116/81  ?02/25/22 (!) 153/67  ?01/21/22 130/88  ?  ?Physical Exam ?Vitals and nursing note reviewed.  ?Constitutional:   ?  Appearance: Normal appearance.  ?HENT:  ?   Head: Normocephalic.  ?Eyes:  ?   Extraocular Movements: Extraocular movements intact.  ?   Conjunctiva/sclera: Conjunctivae normal.  ?   Pupils: Pupils are equal, round, and reactive to light.  ?Cardiovascular:  ?   Rate and Rhythm: Normal rate and regular rhythm.  ?   Pulses: Normal pulses.  ?   Heart sounds: Murmur heard.  ?Pulmonary:  ?   Effort: Pulmonary effort is normal.  ?   Breath sounds: Normal breath sounds.  ?Abdominal:  ?   Palpations: Abdomen is soft.  ?   Tenderness: There is no abdominal tenderness.  ?Musculoskeletal:     ?   General: Swelling (right arm, right abdomen, right thigh) present.  ?   Cervical back: Normal range of motion.  ?Skin: ?   General: Skin is warm.  ?Neurological:  ?   General: No focal deficit present.  ?   Mental Status: He is alert and oriented to person, place, and time.  ?   Cranial Nerves: No cranial nerve deficit.  ?    Motor: No weakness.  ?   Coordination: Coordination normal.  ?   Gait: Gait normal.  ?Psychiatric:     ?   Mood and Affect: Mood normal.     ?   Behavior: Behavior normal.     ?   Thought Content: Thought content normal.     ?   Judgment: Judgment normal.  ? ? ?Results for orders placed or performed during the hospital encounter of 02/24/22  ?Protime-INR  ?Result Value Ref Range  ? Prothrombin Time 13.2 11.4 - 15.2 seconds  ? INR 1.0 0.8 - 1.2  ?APTT  ?Result Value Ref Range  ? aPTT 36 24 - 36 seconds  ?CBC  ?Result Value Ref Range  ? WBC 6.3 4.0 - 10.5 K/uL  ? RBC 4.42 4.22 - 5.81 MIL/uL  ? Hemoglobin 11.8 (L) 13.0 - 17.0 g/dL  ? HCT 36.4 (L) 39.0 - 52.0 %  ? MCV 82.4 80.0 - 100.0 fL  ? MCH 26.7 26.0 - 34.0 pg  ? MCHC 32.4 30.0 - 36.0 g/dL  ? RDW 14.7 11.5 - 15.5 %  ? Platelets 341 150 - 400 K/uL  ? nRBC 0.0 0.0 - 0.2 %  ?Differential  ?Result Value Ref Range  ? Neutrophils Relative % 35 %  ? Neutro Abs 2.2 1.7 - 7.7 K/uL  ? Lymphocytes Relative 51 %  ? Lymphs Abs 3.2 0.7 - 4.0 K/uL  ? Monocytes Relative 9 %  ? Monocytes Absolute 0.6 0.1 - 1.0 K/uL  ? Eosinophils Relative 4 %  ? Eosinophils Absolute 0.2 0.0 - 0.5 K/uL  ? Basophils Relative 1 %  ? Basophils Absolute 0.0 0.0 - 0.1 K/uL  ? Immature Granulocytes 0 %  ? Abs Immature Granulocytes 0.01 0.00 - 0.07 K/uL  ?Comprehensive metabolic panel  ?Result Value Ref Range  ? Sodium 139 135 - 145 mmol/L  ? Potassium 3.6 3.5 - 5.1 mmol/L  ? Chloride 104 98 - 111 mmol/L  ? CO2 29 22 - 32 mmol/L  ? Glucose, Bld 161 (H) 70 - 99 mg/dL  ? BUN 6 (L) 8 - 23 mg/dL  ? Creatinine, Ser 0.87 0.61 - 1.24 mg/dL  ? Calcium 9.5 8.9 - 10.3 mg/dL  ? Total Protein 8.6 (H) 6.5 - 8.1 g/dL  ? Albumin 4.3 3.5 - 5.0 g/dL  ? AST 28 15 - 41 U/L  ? ALT  9 0 - 44 U/L  ? Alkaline Phosphatase 76 38 - 126 U/L  ? Total Bilirubin 0.5 0.3 - 1.2 mg/dL  ? GFR, Estimated >60 >60 mL/min  ? Anion gap 6 5 - 15  ?Glucose, capillary  ?Result Value Ref Range  ? Glucose-Capillary 238 (H) 70 - 99 mg/dL  ?HIV  Antibody (routine testing w rflx)  ?Result Value Ref Range  ? HIV Screen 4th Generation wRfx Non Reactive Non Reactive  ?Glucose, capillary  ?Result Value Ref Range  ? Glucose-Capillary 98 70 - 99 mg/dL  ?CBG monitoring, ED  ?Result Value Ref Range  ? Glucose-Capillary 122 (H) 70 - 99 mg/dL  ?ECHOCARDIOGRAM COMPLETE  ?Result Value Ref Range  ? Weight 2,272 oz  ? Height 67 in  ? BP 153/67 mmHg  ? S' Lateral 2.30 cm  ? Area-P 1/2 3.42 cm2  ? ?   ?Assessment & Plan:  ? ?Problem List Items Addressed This Visit   ? ?  ? Cardiovascular and Mediastinum  ? Essential hypertension (Chronic)  ?  Chronic, stable.  Continue lisinopril 5 mg daily.  Follow-up in 1 month. ? ?  ?  ? Relevant Orders  ? Basic metabolic panel  ? CBC with Differential/Platelet  ?  ? Endocrine  ? Diabetes mellitus without complication ([place])  ?  He states that his sugars have been more controlled recently.  He is taking metformin 1000 mg daily.  He has stopped the [place], as he thinks that this is started the symptoms that he was having prior to his stroke.  Since his blood sugars have been running 90s-120s, will continue metformin for now and recheck A1c next visit. ? ?  ?  ?  ? Other  ? Dyslipidemia (Chronic)  ?  Continue atorvastatin 80 mg daily.  We will recheck lipid panel next visit. ? ?  ?  ? Parotid mass  ?  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  We will place referral to ENT. ? ?  ?  ? Relevant Orders  ? Ambulatory referral to ENT  ? History of CVA (cerebrovascular accident) - Primary  ?  On 02/24/22 he went to the ER after having numbness and tingling in his right arm and leg for 5 days.  He had a CT and MRI which showed an acute lacunar infarct in his left thalamus.  He was outside the window for acute treatment, is being treated medically.  He had a 2D echo which showed an EF of 60 to 65% with mild left ventricular hypertrophy.  He is currently taking aspirin 81 mg daily for 14 days, Plavix 75 mg daily, atorvastatin  80 mg daily.  Recommend that he continue these medications.  He does not have an appointment scheduled with neurology, referral placed today.  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

## 2022-03-03 ENCOUNTER — Ambulatory Visit (INDEPENDENT_AMBULATORY_CARE_PROVIDER_SITE_OTHER): Payer: Medicare (Managed Care) | Admitting: Nurse Practitioner

## 2022-03-03 ENCOUNTER — Encounter: Payer: Self-pay | Admitting: Nurse Practitioner

## 2022-03-03 VITALS — BP 116/81 | HR 77 | Temp 97.0°F | Wt 133.4 lb

## 2022-03-03 DIAGNOSIS — D649 Anemia, unspecified: Secondary | ICD-10-CM

## 2022-03-03 DIAGNOSIS — I10 Essential (primary) hypertension: Secondary | ICD-10-CM | POA: Diagnosis not present

## 2022-03-03 DIAGNOSIS — Z8673 Personal history of transient ischemic attack (TIA), and cerebral infarction without residual deficits: Secondary | ICD-10-CM | POA: Diagnosis not present

## 2022-03-03 DIAGNOSIS — K118 Other diseases of salivary glands: Secondary | ICD-10-CM

## 2022-03-03 DIAGNOSIS — D509 Iron deficiency anemia, unspecified: Secondary | ICD-10-CM

## 2022-03-03 DIAGNOSIS — E785 Hyperlipidemia, unspecified: Secondary | ICD-10-CM | POA: Diagnosis not present

## 2022-03-03 DIAGNOSIS — E119 Type 2 diabetes mellitus without complications: Secondary | ICD-10-CM

## 2022-03-03 LAB — BASIC METABOLIC PANEL
BUN: 13 mg/dL (ref 6–23)
CO2: 26 mEq/L (ref 19–32)
Calcium: 9.8 mg/dL (ref 8.4–10.5)
Chloride: 98 mEq/L (ref 96–112)
Creatinine, Ser: 1.1 mg/dL (ref 0.40–1.50)
GFR: 70.39 mL/min (ref >60.00)
Glucose, Bld: 209 mg/dL — ABNORMAL HIGH (ref 70–99)
Potassium: 4.1 mEq/L (ref 3.5–5.1)
Sodium: 135 mEq/L (ref 135–145)

## 2022-03-03 NOTE — Patient Instructions (Addendum)
It was great to see you! ? ?We are checking your labs today and will let you know the results via mychart/phone.  ? ?Keep taking the metformin daily, along with your plavix, atorvastatin, and lisinopril.  ? ?Stop the jardiance. Keep your arm and leg elevated when able and you can wear a compression sock/wrap. Follow-up with neurology.  ? ?Follow-up with a DOT provider about clearance for going back to work.  ? ?Let's follow-up in 1 month, sooner if you have concerns. ? ?If a referral was placed today, you will be contacted for an appointment. Please note that routine referrals can sometimes take up to 3-4 weeks to process. Please call our office if you haven't heard anything after this time frame. ? ?Take care, ? ?Vance Peper, NP ? ?

## 2022-03-03 NOTE — Assessment & Plan Note (Signed)
On 02/24/22 he went to the ER after having numbness and tingling in his right arm and leg for 5 days.  He had a CT and MRI which showed an acute lacunar infarct in his left thalamus.  He was outside the window for acute treatment, is being treated medically.  He had a 2D echo which showed an EF of 60 to 65% with mild left ventricular hypertrophy.  He is currently taking aspirin 81 mg daily for 14 days, Plavix 75 mg daily, atorvastatin 80 mg daily.  Recommend that he continue these medications.  He does not have an appointment scheduled with neurology, referral placed today.  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  We will check a BMP and CBC today. ?

## 2022-03-03 NOTE — Assessment & Plan Note (Signed)
Chronic, stable.  Continue lisinopril 5 mg daily.  Follow-up in 1 month. ?

## 2022-03-03 NOTE — Assessment & Plan Note (Addendum)
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  We will place referral to ENT. ?

## 2022-03-03 NOTE — Assessment & Plan Note (Signed)
His blood counts have been slightly low on last several readings.  We will check CBC and iron panel today. ?

## 2022-03-03 NOTE — Assessment & Plan Note (Signed)
Continue atorvastatin 80 mg daily.  We will recheck lipid panel next visit. ?

## 2022-03-03 NOTE — Assessment & Plan Note (Signed)
He states that his sugars have been more controlled recently.  He is taking metformin 1000 mg daily.  He has stopped the [place], as he thinks that this is started the symptoms that he was having prior to his stroke.  Since his blood sugars have been running 90s-120s, will continue metformin for now and recheck A1c next visit. ?

## 2022-03-04 LAB — IRON,TIBC AND FERRITIN PANEL
%SAT: 19 % (calc) — ABNORMAL LOW (ref 20–48)
Ferritin: 16 ng/mL — ABNORMAL LOW (ref 24–380)
Iron: 75 ug/dL (ref 50–180)
TIBC: 404 mcg/dL (calc) (ref 250–425)

## 2022-03-04 NOTE — Progress Notes (Signed)
? ?NEUROLOGY CONSULTATION NOTE ? ?Gary Gay ?MRN: [id number] ?DOB: 19-Aug-1956 ? ?Referring provider: Vance Peper, NP ?Primary care provider: Vance Peper, NP ? ?Reason for consult:  stroke ? ?Assessment/Plan:  ? ?Left thalamic stroke, likely secondary to small vessel disease ?Type 2 diabetes mellitus, uncontrolled ?Cigarette smoker ?Hypertension ?Hyperlipidemia ?Parotid gland mass, incidental finding ? ?1  Continue DAPT.  Once finished 21 days of Plavix, continue ASA '81mg'$  daily monotherapy ?2  Secondary stroke prevention as managed by PCP: ? - Hgb A1c goal less than 7 ? - Statin.  LDL goal less than 70 ? - Normotensive blood pressure ?3  Mediterranean diet and routine exercise. ?4  Smoking cessation ?5  Patient states he was not referred to ENT regarding the parotid mass.  Advised to follow up with PCP about referral to ENT ?6  Follow up 5 months ? 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  However, I do think that he is cleared to work in any other capacity.   ? ? ?Subjective:  ?Gary Gay is a 66 year old right-handed male with DM II and HTN and cigarette smoker who presents for stroke.  History supplemented by hospital records and PCP's note.  MRI L-spine, CT head, CTA head and neck and MRI brain personally reviewed. ? ?He presented to [facility] on 02/24/2022 for 5 day history of right sided facial, arm and leg numbness.  Noted weakness in the right hand, would drop objects.  Sometimes would drag his right foot.  He also endorsed swelling and pain of the right leg as well.  CT head showed small age-indeterminate left parathalamic infarct.  Follow up MRI of brain confirmed acute lacunar infarct in the left thalamus.  CTA of head and neck showed 60% left ICA origin stenosis and severe right vertebral artery origin stenosis but no major intracranial LVO or significant stenosis.  TTE showed EF 60-65% with no obvious source of emboli.  Prior  blood work from 01/16/2022 demonstrated LDL 107 and Hgb A1c of 12.8.  He was reportedly taking ASA but not daily.  He was discharged on ASA '81mg'$  and Plavix '75mg'$  DAPT and started on atorvastatin '80mg'$  daily.  Venous doppler of right leg negative for DVT.  He had an MRI of lumbar spine which showed moderate stenosis at L4-5 and L5-S1.  Imaging revealed parotid mass.  ENT referral recommended.   ? ?Still feels numb in right arm and leg.  Leg still feels swollen.  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   ?  ? ? ?PAST MEDICAL HISTORY: ?Past Medical History:  ?Diagnosis Date  ? Diabetes mellitus   ? ? ?PAST SURGICAL HISTORY: ?Past Surgical History:  ?Procedure Laterality Date  ? SHOULDER SURGERY Right   ? ? ?MEDICATIONS: ?Current Outpatient Medications on File Prior to Visit  ?Medication Sig Dispense Refill  ? acetaminophen (TYLENOL) 325 MG tablet Take 650 mg by mouth every 6 (six) hours as needed for mild pain.    ? aspirin 81 MG EC tablet Take 1 tablet (81 mg total) by mouth daily for 14 days. 14 tablet 0  ? atorvastatin (LIPITOR) 80 MG tablet Take 1 tablet (80 mg total) by mouth daily. 30 tablet 0  ? clopidogrel (PLAVIX) 75 MG tablet Take 1 tablet (75 mg total) by mouth daily. 30 tablet 0  ? lisinopril (ZESTRIL) 5 MG tablet Take  1 tablet (5 mg total) by mouth daily. (Patient not taking: Reported on 02/25/2022) 30 tablet 2  ? metFORMIN (GLUCOPHAGE-XR) 500 MG 24 hr tablet Take 2 tablets (1,000 mg total) by mouth daily with breakfast. 180 tablet 0  ? ?No current facility-administered medications on file prior to visit.  ? ? ?ALLERGIES: ?No Known Allergies ? ?FAMILY HISTORY: ?Family History  ?Problem Relation Age of Onset  ? Cancer Sister   ?     pancreatic  ? Diabetes Maternal Grandmother   ? Stroke Neg Hx   ? ? ?Objective:  ?Blood pressure 134/72, pulse 80, height '5\' 7"'$  (1.702 m), weight 134 lb 6.4 oz (61 kg), SpO2 100 %. ?General: No acute distress.  Patient appears well-groomed.    ?Head:  Normocephalic/atraumatic ?Eyes:  fundi examined but not visualized ?Heart: regular rate and rhythm ?Neurological Exam: ?Mental status: alert and oriented to person, place, and time, recent and remote memory intact, fund of knowledge intact, attention and concentration intact, speech fluent and not dysarthric, language intact. ?Cranial nerves: ?CN I: not tested ?CN II: pupils equal, round and reactive to light, visual fields intact ?CN III, IV, VI:  full range of motion, no nystagmus, no ptosis ?CN V: facial sensation intact. ?CN VII: upper and lower face symmetric ?CN VIII: hearing intact ?CN IX, X: gag intact, uvula midline ?CN XI: sternocleidomastoid and trapezius muscles intact ?CN XII: tongue midline ?Bulk & Tone: normal, no fasciculations. ?Motor:  muscle strength 5/5 throughout ?Sensation:  Pinprick, temperature and vibratory sensation intact. ?Deep Tendon Reflexes:  1+ throughout,  toes downgoing.   ?Finger to nose testing:  Without dysmetria.   ?Heel to shin:  Without dysmetria.   ?Gait:  Normal station and stride.  Romberg negative. ? ? ? ?Thank you for allowing me to take part in the care of this patient. ? ?Metta Clines, DO ? ?CC: Vance Peper, NP ? ? ? ? ?

## 2022-03-05 ENCOUNTER — Other Ambulatory Visit (INDEPENDENT_AMBULATORY_CARE_PROVIDER_SITE_OTHER): Payer: Medicare (Managed Care)

## 2022-03-05 ENCOUNTER — Other Ambulatory Visit: Payer: Self-pay

## 2022-03-05 ENCOUNTER — Encounter: Payer: Self-pay | Admitting: Neurology

## 2022-03-05 ENCOUNTER — Ambulatory Visit (INDEPENDENT_AMBULATORY_CARE_PROVIDER_SITE_OTHER): Payer: Medicare (Managed Care) | Admitting: Neurology

## 2022-03-05 ENCOUNTER — Telehealth: Payer: Self-pay | Admitting: Nurse Practitioner

## 2022-03-05 VITALS — BP 134/72 | HR 80 | Ht 67.0 in | Wt 134.4 lb

## 2022-03-05 DIAGNOSIS — E785 Hyperlipidemia, unspecified: Secondary | ICD-10-CM

## 2022-03-05 DIAGNOSIS — I6381 Other cerebral infarction due to occlusion or stenosis of small artery: Secondary | ICD-10-CM | POA: Diagnosis not present

## 2022-03-05 DIAGNOSIS — I10 Essential (primary) hypertension: Secondary | ICD-10-CM

## 2022-03-05 DIAGNOSIS — F17200 Nicotine dependence, unspecified, uncomplicated: Secondary | ICD-10-CM

## 2022-03-05 DIAGNOSIS — E119 Type 2 diabetes mellitus without complications: Secondary | ICD-10-CM

## 2022-03-05 DIAGNOSIS — E1165 Type 2 diabetes mellitus with hyperglycemia: Secondary | ICD-10-CM | POA: Diagnosis not present

## 2022-03-05 DIAGNOSIS — K118 Other diseases of salivary glands: Secondary | ICD-10-CM

## 2022-03-05 DIAGNOSIS — Z794 Long term (current) use of insulin: Secondary | ICD-10-CM

## 2022-03-05 LAB — CBC WITH DIFFERENTIAL/PLATELET
Basophils Absolute: 0 10*3/uL (ref 0.0–0.1)
Basophils Relative: 0.8 % (ref 0.0–3.0)
Eosinophils Absolute: 0.1 10*3/uL (ref 0.0–0.7)
Eosinophils Relative: 2.9 % (ref 0.0–5.0)
HCT: 32 % — ABNORMAL LOW (ref 39.0–52.0)
Hemoglobin: 10.4 g/dL — ABNORMAL LOW (ref 13.0–17.0)
Lymphocytes Relative: 46.2 % — ABNORMAL HIGH (ref 12.0–46.0)
Lymphs Abs: 2.4 10*3/uL (ref 0.7–4.0)
MCHC: 32.4 g/dL (ref 30.0–36.0)
MCV: 82 fl (ref 78.0–100.0)
Monocytes Absolute: 0.4 10*3/uL (ref 0.1–1.0)
Monocytes Relative: 7.8 % (ref 3.0–12.0)
Neutro Abs: 2.2 10*3/uL (ref 1.4–7.7)
Neutrophils Relative %: 42.3 % — ABNORMAL LOW (ref 43.0–77.0)
Platelets: 315 10*3/uL (ref 150.0–400.0)
RBC: 3.9 Mil/uL — ABNORMAL LOW (ref 4.22–5.81)
RDW: 15.6 % — ABNORMAL HIGH (ref 11.5–15.5)
WBC: 5.1 10*3/uL (ref 4.0–10.5)

## 2022-03-05 MED ORDER — SITAGLIPTIN PHOSPHATE 100 MG PO TABS: 100.0000 mg | ORAL_TABLET | Freq: Every day | ORAL | 2 refills | Status: DC

## 2022-03-05 NOTE — Progress Notes (Signed)
Lab Orders    ?     CBC with Differential/Platelet     ?

## 2022-03-05 NOTE — Addendum Note (Signed)
Addended by: Vance Peper A on: 03/05/2022 07:51 AM ? ? Modules accepted: Orders ? ?

## 2022-03-05 NOTE — Patient Instructions (Addendum)
Continue aspirin '81mg'$  daily and clopidogrel '75mg'$  daily for the rest of the 21 days, then stop clopidogrel and continue aspirin '81mg'$  daily ?Continue atorvastatin.  Repeat LDL in 3 months.   ?Blood pressure control.   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 ?

## 2022-03-05 NOTE — Progress Notes (Signed)
Called and informed patient of results and provider instructions. Patient voiced understanding. Pt okay with trying [place]. Scheduled pt for lab visit

## 2022-03-05 NOTE — Telephone Encounter (Signed)
Pt wonders if something can be prescribed for some swelling on left side of his body. ? ?

## 2022-03-06 ENCOUNTER — Other Ambulatory Visit: Payer: Medicare (Managed Care)

## 2022-03-06 NOTE — Progress Notes (Signed)
Pt picked up hemaccult cards from clinic on 03/05/22. Called and ldvm to f/u with neurologist per provider recommendation regarding swelling in leg.

## 2022-03-13 ENCOUNTER — Encounter: Payer: Self-pay | Admitting: Nurse Practitioner

## 2022-03-13 ENCOUNTER — Other Ambulatory Visit: Payer: Self-pay | Admitting: Nurse Practitioner

## 2022-03-13 MED ORDER — GABAPENTIN 100 MG PO CAPS: 100.0000 mg | ORAL_CAPSULE | Freq: Every day | ORAL | 1 refills | Status: DC

## 2022-03-13 NOTE — Progress Notes (Signed)
He is still having numbness and slight swelling in his right hand and leg. Will send in gabapentin '100mg'$  to take a bedtime. Keep next scheduled appointment.

## 2022-04-09 ENCOUNTER — Telehealth: Payer: Self-pay | Admitting: Nurse Practitioner

## 2022-04-09 NOTE — Telephone Encounter (Signed)
Called and LVM for patient to cb and schedule OV. Sw, cma ?

## 2022-04-09 NOTE — Telephone Encounter (Signed)
Pt called and would like you to give him a call back. Pt states it is about some pain he is going through

## 2022-04-09 NOTE — Telephone Encounter (Signed)
Returned pt phone call, pt states he is still experiencing swelling and numbness. Pt was advised to schedule appt w/ Lauren. Appt made for 04/10/22 at 9:20am.  Sw, cma

## 2022-04-09 NOTE — Telephone Encounter (Signed)
Pt was returning your call from earlier today. Said you got disconnected.

## 2022-04-10 ENCOUNTER — Encounter: Payer: Self-pay | Admitting: Nurse Practitioner

## 2022-04-10 ENCOUNTER — Ambulatory Visit (INDEPENDENT_AMBULATORY_CARE_PROVIDER_SITE_OTHER): Payer: Medicare (Managed Care) | Admitting: Nurse Practitioner

## 2022-04-10 VITALS — BP 130/80 | HR 79 | Temp 96.9°F | Ht 67.0 in | Wt 134.6 lb

## 2022-04-10 DIAGNOSIS — E119 Type 2 diabetes mellitus without complications: Secondary | ICD-10-CM | POA: Diagnosis not present

## 2022-04-10 DIAGNOSIS — D649 Anemia, unspecified: Secondary | ICD-10-CM | POA: Diagnosis not present

## 2022-04-10 DIAGNOSIS — R200 Anesthesia of skin: Secondary | ICD-10-CM

## 2022-04-10 LAB — POCT GLYCOSYLATED HEMOGLOBIN (HGB A1C)
HbA1c POC (<> result, manual entry): 9 % (ref 4.0–5.6)
HbA1c, POC (controlled diabetic range): 9 % — AB (ref 0.0–7.0)
HbA1c, POC (prediabetic range): 9 % — AB (ref 5.7–6.4)
Hemoglobin A1C: 9 % — AB (ref 4.0–5.6)

## 2022-04-10 MED ORDER — GABAPENTIN 100 MG PO CAPS: 100.0000 mg | ORAL_CAPSULE | Freq: Two times a day (BID) | ORAL | 1 refills | Status: DC

## 2022-04-10 MED ORDER — DAPAGLIFLOZIN PROPANEDIOL 5 MG PO TABS: 5.0000 mg | ORAL_TABLET | Freq: Every day | ORAL | 2 refills | Status: DC

## 2022-04-10 NOTE — Progress Notes (Signed)
Established Patient Office Visit  Subjective   Patient ID: Gary Gay, male    DOB: 05-Sep-1956  Age: 66 y.o. MRN: [id number]  Chief Complaint  Patient presents with   Acute Visit    C/o right leg and arm swelling & numbness No other concerns      HPI  Gary Gay is here today to follow-up on right arm and leg numbness long with his diabetes.  He has been having some mild swelling and numbness in his right arm from his shoulder to his fingertips down his right side and then to his right hip to his right knee since his stroke.  He was started on gabapentin 100 mg at bedtime which slightly helped at first.  He denies any weakness, trouble moving his arm or leg, slurred speech.  Past Medical History:  Diagnosis Date   Diabetes mellitus    Past Surgical History:  Procedure Laterality Date   SHOULDER SURGERY Right     ROS See pertinent positives and negatives per HPI.    Objective:     BP 130/80 (BP Location: Left Arm, Patient Position: Sitting, Cuff Size: Small)   Pulse 79   Temp (!) 96.9 F (36.1 C) (Temporal)   Ht '5\' 7"'$  (1.702 m)   Wt 134 lb 9.6 oz (61.1 kg)   SpO2 99%   BMI 21.08 kg/m  BP Readings from Last 3 Encounters:  04/10/22 130/80  03/05/22 134/72  03/03/22 116/81      Physical Exam Vitals and nursing note reviewed.  Constitutional:      Appearance: Normal appearance.  HENT:     Head: Normocephalic.  Eyes:     Conjunctiva/sclera: Conjunctivae normal.  Cardiovascular:     Rate and Rhythm: Normal rate and regular rhythm.     Pulses: Normal pulses.     Heart sounds: Normal heart sounds.  Pulmonary:     Effort: Pulmonary effort is normal.     Breath sounds: Normal breath sounds.  Musculoskeletal:        General: No swelling or tenderness. Normal range of motion.     Cervical back: Normal range of motion.  Skin:    General: Skin is warm.  Neurological:     General: No focal deficit present.     Mental Status: He is alert and oriented to person,  place, and time.     Motor: No weakness.  Psychiatric:        Mood and Affect: Mood normal.        Behavior: Behavior normal.        Thought Content: Thought content normal.        Judgment: Judgment normal.      Results for orders placed or performed in visit on 04/10/22  POCT glycosylated hemoglobin (Hb A1C)  Result Value Ref Range   Hemoglobin A1C 9.0 (A) 4.0 - 5.6 %   HbA1c POC (<> result, manual entry) 9.0 4.0 - 5.6 %   HbA1c, POC (prediabetic range) 9.0 (A) 5.7 - 6.4 %   HbA1c, POC (controlled diabetic range) 9.0 (A) 0.0 - 7.0 %    Last CBC Lab Results  Component Value Date   WBC 5.1 03/05/2022   HGB 10.4 (L) 03/05/2022   HCT 32.0 (L) 03/05/2022   MCV 82.0 03/05/2022   MCH 26.7 02/24/2022   RDW 15.6 (H) 03/05/2022   PLT 315.0 50/53/9767   Last metabolic panel Lab Results  Component Value Date   GLUCOSE 209 (H) 03/03/2022   NA  135 03/03/2022   K 4.1 03/03/2022   CL 98 03/03/2022   CO2 26 03/03/2022   BUN 13 03/03/2022   CREATININE 1.10 03/03/2022   GFRNONAA >60 02/24/2022   CALCIUM 9.8 03/03/2022   PROT 8.6 (H) 02/24/2022   ALBUMIN 4.3 02/24/2022   BILITOT 0.5 02/24/2022   ALKPHOS 76 02/24/2022   AST 28 02/24/2022   ALT 9 02/24/2022   ANIONGAP 6 02/24/2022   Last lipids Lab Results  Component Value Date   CHOL 169 01/16/2022   HDL 40.00 01/16/2022   LDLCALC 107 (H) 01/16/2022   TRIG 108.0 01/16/2022   CHOLHDL 4 01/16/2022      The ASCVD Risk score (Arnett DK, et al., 2019) failed to calculate for the following reasons:   The patient has a prior MI or stroke diagnosis    Assessment & Plan:   Problem List Items Addressed This Visit       Endocrine   Diabetes mellitus without complication (Gary Gay) - Primary    A1c has improved today from 12.8% to 9%.  We will continue his metformin, Januvia, and start him on Farxiga 5 mg daily.  Encouraged him to check his blood sugars at home intermittently.  We discussed nutrition and limiting sugars and  carbs.  Encouraged him to get his eye exam yearly.  Follow-up in 3 months.      Relevant Medications   aspirin EC 81 MG tablet   dapagliflozin propanediol (FARXIGA) 5 MG TABS tablet   Other Relevant Orders   POCT glycosylated hemoglobin (Hb A1C) (Completed)     Other   Anemia    Discussed that his blood counts are low and really recommend that he form the Hemoccult cards at home.  He states that he has them, he just has forgotten.  Also discussed that I could refer him for colonoscopy if this would be easier.  He states that he would like to do the Hemoccult cards the next time he goes to the bathroom.      Numbness    He is having ongoing numbness in his right arm and leg after his stroke.  There is no weakness, he can sense when touching his arm and leg.  We will increase his gabapentin to 100 mg twice a day.  Encouraged him to also follow-up with neurology if he is having ongoing symptoms.       Return in about 3 months (around 07/11/2022) for Diabetes.    Charyl Dancer, NP

## 2022-04-10 NOTE — Assessment & Plan Note (Signed)
Discussed that his blood counts are low and really recommend that he form the Hemoccult cards at home.  He states that he has them, he just has forgotten.  Also discussed that I could refer him for colonoscopy if this would be easier.  He states that he would like to do the Hemoccult cards the next time he goes to the bathroom.

## 2022-04-10 NOTE — Patient Instructions (Addendum)
It was great to see you!  Call Dr. Redmond Baseman to schedule an appointment for the parotid mass with ENT  Hessie Knows, MD   [facility]   [place], [place] [address]   Phone: [phone]    Make sure you do the hemoccult cards that we gave to you last visit. If you prefer, I can refer you to a stomach doctor for a colonoscopy.   Increase your gabapentin to twice a day to help with the numbness. If this doesn't help, follow-up with your neurologist sooner than August.   Start farxiga 1 tablet daily for your blood sugars.   Let's follow-up in 3 months, sooner if you have concerns.  If a referral was placed today, you will be contacted for an appointment. Please note that routine referrals can sometimes take up to 3-4 weeks to process. Please call our office if you haven't heard anything after this time frame.  Take care,  Vance Peper, NP

## 2022-04-10 NOTE — Assessment & Plan Note (Signed)
A1c has improved today from 12.8% to 9%.  We will continue his metformin, Januvia, and start him on Farxiga 5 mg daily.  Encouraged him to check his blood sugars at home intermittently.  We discussed nutrition and limiting sugars and carbs.  Encouraged him to get his eye exam yearly.  Follow-up in 3 months.

## 2022-04-10 NOTE — Assessment & Plan Note (Signed)
He is having ongoing numbness in his right arm and leg after his stroke.  There is no weakness, he can sense when touching his arm and leg.  We will increase his gabapentin to 100 mg twice a day.  Encouraged him to also follow-up with neurology if he is having ongoing symptoms.

## 2022-04-15 ENCOUNTER — Telehealth: Payer: Self-pay | Admitting: Nurse Practitioner

## 2022-04-15 NOTE — Telephone Encounter (Signed)
Noted. Provider is out rest of the week. Will return on Monday. Form will be placed on desk for review once provider returns. Sw, cma

## 2022-04-15 NOTE — Telephone Encounter (Signed)
Pt has brought in a form to be filled out by [employer] DOT Medical Examiner Letter to Clinician. He is wanting this form faxed to [phone] when completed. I have placed in Lauren's folder upfront.

## 2022-04-16 ENCOUNTER — Telehealth: Payer: Self-pay

## 2022-04-16 NOTE — Telephone Encounter (Signed)
Called to inform pt of pcp absence and pt confirmed he is okay with timeframe of paper work completion.   Pt states he feels lisinopril is causing his entire right side to swell and continued tingling feeling all over. Pt also stated he has stopped taking medication on his own and he no longer feels the tingling sensation on his right side. Explained to pt both provider and covering provider will be notified. Sw, cma

## 2022-04-21 ENCOUNTER — Telehealth: Payer: Self-pay | Admitting: Nurse Practitioner

## 2022-04-21 ENCOUNTER — Ambulatory Visit: Payer: Medicare (Managed Care) | Admitting: Nurse Practitioner

## 2022-04-21 NOTE — Telephone Encounter (Signed)
Pt sated you was on the phone and got disconnected . Give him a call back

## 2022-04-23 ENCOUNTER — Telehealth: Payer: Self-pay | Admitting: Nurse Practitioner

## 2022-04-23 NOTE — Telephone Encounter (Signed)
They received the fax for the DOT but the consent form on 2nd page was not done . If you can please fax to [phone] or call jazz  or front desk at [phone]

## 2022-04-23 NOTE — Telephone Encounter (Signed)
Called and spoke to rep frm [employer], we did not receive second page of form. Once pt brings 2nd page will fax to appropriate party

## 2022-05-05 ENCOUNTER — Telehealth: Payer: Self-pay | Admitting: Nurse Practitioner

## 2022-05-05 NOTE — Telephone Encounter (Signed)
Pt stated that the meds prescribed for his stroke are not working. He has an appt on Thursday at 33.

## 2022-05-06 NOTE — Telephone Encounter (Signed)
noted 

## 2022-05-08 ENCOUNTER — Encounter: Payer: Self-pay | Admitting: Nurse Practitioner

## 2022-05-08 ENCOUNTER — Ambulatory Visit: Payer: Medicare (Managed Care) | Admitting: Nurse Practitioner

## 2022-05-08 VITALS — BP 138/88 | HR 77 | Temp 97.2°F | Wt 129.8 lb

## 2022-05-08 DIAGNOSIS — R200 Anesthesia of skin: Secondary | ICD-10-CM

## 2022-05-08 MED ORDER — LOSARTAN POTASSIUM 25 MG PO TABS: 25.0000 mg | ORAL_TABLET | Freq: Every day | ORAL | 1 refills | Status: DC

## 2022-05-08 NOTE — Assessment & Plan Note (Signed)
He is still having ongoing numbness and tingling along his right face, right arm, and right leg down to this knee. He stopped his lisinopril which helped some of symptoms get better, however then he restarted it again.  We will have him stop his lisinopril and start losartan 12.5 mg daily.  Also instructed him to take his gabapentin twice a day in the morning and afternoon instead of 2 pills once a day in the morning.  The symptoms have been ongoing since his stroke, encouraged him to follow-up with neurology.  Follow-up in 2 months.

## 2022-05-08 NOTE — Patient Instructions (Signed)
It was great to see you!  Stop the lisinopril. Start losartan 1/2 tablet daily.   Make sure you are taking gabapentin 1 pill twice a day, once in the morning, and once in the evening.   Please do the stool cards and bring them to the office.   Let's follow-up in 2 months, sooner if you have concerns.  If a referral was placed today, you will be contacted for an appointment. Please note that routine referrals can sometimes take up to 3-4 weeks to process. Please call our office if you haven't heard anything after this time frame.  Take care,  Vance Peper, NP

## 2022-05-08 NOTE — Progress Notes (Signed)
   Established Patient Office Visit  Subjective   Patient ID: Gary Gay, male    DOB: 1956/08/08  Age: 66 y.o. MRN: [id number]  Chief Complaint  Patient presents with   Follow-up    4 wk f/u [facility]. Pt states he is still experiencing swelling in his left leg and left side side of neck. Pt states gabapentin works better at night.     HPI  Gary Gay is here for numbness and tingling in his right side. He has been taking the gabapentin. He states that he went fishing last weekend with his son and when he was walking, his right leg felt like a "rubberband" and he fell. He denies injury. He went home and and layed down and slept for several hours. He states that he stopped his lisinopril and his symptoms had improved some, however he re-started the medication again. He has been taking all of his medications at once in the mornings. He denies chest pain, shortness of breath, and headaches.     ROS See pertinent positives and negatives per HPI.    Objective:     BP 138/88   Pulse 77   Temp (!) 97.2 F (36.2 C) (Temporal)   Wt 129 lb 12.8 oz (58.9 kg)   SpO2 99%   BMI 20.33 kg/m    Physical Exam Vitals and nursing note reviewed.  Constitutional:      Appearance: Normal appearance.  HENT:     Head: Normocephalic.  Eyes:     Conjunctiva/sclera: Conjunctivae normal.  Cardiovascular:     Rate and Rhythm: Normal rate and regular rhythm.     Pulses: Normal pulses.     Heart sounds: Normal heart sounds.  Pulmonary:     Effort: Pulmonary effort is normal.     Breath sounds: Normal breath sounds.  Musculoskeletal:     Cervical back: Normal range of motion.  Skin:    General: Skin is warm.  Neurological:     General: No focal deficit present.     Mental Status: He is alert and oriented to person, place, and time.  Psychiatric:        Mood and Affect: Mood normal.        Behavior: Behavior normal.        Thought Content: Thought content normal.        Judgment: Judgment  normal.     Assessment & Plan:   Problem List Items Addressed This Visit       Other   Numbness - Primary    He is still having ongoing numbness and tingling along his right face, right arm, and right leg down to this knee. He stopped his lisinopril which helped some of symptoms get better, however then he restarted it again.  We will have him stop his lisinopril and start losartan 12.5 mg daily.  Also instructed him to take his gabapentin twice a day in the morning and afternoon instead of 2 pills once a day in the morning.  The symptoms have been ongoing since his stroke, encouraged him to follow-up with neurology.  Follow-up in 2 months.       Return in about 2 months (around 07/09/2022) for Diabetes.    Charyl Dancer, NP

## 2022-05-13 ENCOUNTER — Telehealth: Payer: Self-pay | Admitting: Nurse Practitioner

## 2022-05-13 NOTE — Telephone Encounter (Signed)
Caller Name: Jennifer Holland  Call back phone #: [phone]   Reason for Call: Pt walked in to have documentation filled out by Dr. Vance Peper for VA benefits. He also had a question about disability and FMLA. Asked for a call back.

## 2022-05-13 NOTE — Telephone Encounter (Signed)
I put the file he would like signed in Lauren's mailbox

## 2022-05-14 NOTE — Telephone Encounter (Signed)
Called and ldvm. Form given to pcp for review. Pt can cb if he has any additional questions or concerns. Sw, cma

## 2022-05-16 ENCOUNTER — Encounter: Payer: Self-pay | Admitting: Nurse Practitioner

## 2022-06-18 ENCOUNTER — Telehealth: Payer: Self-pay | Admitting: Nurse Practitioner

## 2022-06-18 NOTE — Telephone Encounter (Signed)
Left message for patient to call back and schedule Medicare Annual Wellness Visit (AWV).   Please offer to do virtually or by telephone.  Left office number and my jabber #[phone].  AWVI eligible as of 06/27/2022  Please schedule at anytime with Nurse Health Advisor.  

## 2022-06-18 NOTE — Telephone Encounter (Signed)
Pt stated that he was returning your call please give him a call back

## 2022-06-25 ENCOUNTER — Telehealth: Payer: Self-pay | Admitting: Nurse Practitioner

## 2022-06-25 NOTE — Telephone Encounter (Signed)
Caller Name: Pt Call back phone #: [phone]  Reason for Call: Pt needs refills on 7 meds. Please call to clarify. He asked for [place].

## 2022-06-25 NOTE — Telephone Encounter (Signed)
Called and ldvm. Sw,cma Advised to call back directly if there are further questions. 

## 2022-06-26 MED ORDER — DAPAGLIFLOZIN PROPANEDIOL 5 MG PO TABS: 5.0000 mg | ORAL_TABLET | Freq: Every day | ORAL | 2 refills | Status: DC

## 2022-06-26 MED ORDER — ATORVASTATIN CALCIUM 80 MG PO TABS: 80.0000 mg | ORAL_TABLET | Freq: Every day | ORAL | 1 refills | Status: DC

## 2022-06-26 MED ORDER — CLOPIDOGREL BISULFATE 75 MG PO TABS: 75.0000 mg | ORAL_TABLET | Freq: Every day | ORAL | 1 refills | Status: DC

## 2022-06-26 MED ORDER — LOSARTAN POTASSIUM 25 MG PO TABS: 25.0000 mg | ORAL_TABLET | Freq: Every day | ORAL | 2 refills | Status: DC

## 2022-06-26 NOTE — Telephone Encounter (Signed)
Requested medication has been approved and sent to patient's preferred pharmacy. Please follow-up with pharmacy to check for additional refills.   

## 2022-07-01 ENCOUNTER — Ambulatory Visit: Payer: Medicare PPO

## 2022-07-01 ENCOUNTER — Telehealth: Payer: Self-pay

## 2022-07-01 NOTE — Telephone Encounter (Signed)
Called patient x3 with no answer, Patient may reschedule for the next availiable appointment .  L.Wilson,LPN

## 2022-07-07 NOTE — Telephone Encounter (Signed)
Noted  

## 2022-07-09 ENCOUNTER — Ambulatory Visit: Payer: Medicare (Managed Care) | Admitting: Nurse Practitioner

## 2022-07-10 NOTE — Progress Notes (Unsigned)
   Established Patient Office Visit  Subjective   Patient ID: Gary Gay, male    DOB: Jan 15, 1956  Age: 66 y.o. MRN: [id number]  No chief complaint on file.   HPI  [name] is here to follow-up on diabetes.   {History (Optional):23778}  ROS    Objective:     There were no vitals taken for this visit. {Vitals History (Optional):23777}  Physical Exam   No results found for any visits on 07/11/22.  {Labs (Optional):23779}  The ASCVD Risk score (Arnett DK, et al., 2019) failed to calculate for the following reasons:   The patient has a prior MI or stroke diagnosis    Assessment & Plan:   Problem List Items Addressed This Visit   None   No follow-ups on file.    Charyl Dancer, NP

## 2022-07-11 ENCOUNTER — Encounter: Payer: Self-pay | Admitting: Nurse Practitioner

## 2022-07-11 ENCOUNTER — Other Ambulatory Visit: Payer: Self-pay | Admitting: Nurse Practitioner

## 2022-07-11 ENCOUNTER — Ambulatory Visit (INDEPENDENT_AMBULATORY_CARE_PROVIDER_SITE_OTHER): Payer: Medicare PPO | Admitting: Nurse Practitioner

## 2022-07-11 VITALS — BP 100/80 | HR 65 | Temp 97.7°F | Wt 124.6 lb

## 2022-07-11 DIAGNOSIS — R200 Anesthesia of skin: Secondary | ICD-10-CM | POA: Diagnosis not present

## 2022-07-11 DIAGNOSIS — Z23 Encounter for immunization: Secondary | ICD-10-CM

## 2022-07-11 DIAGNOSIS — E119 Type 2 diabetes mellitus without complications: Secondary | ICD-10-CM

## 2022-07-11 DIAGNOSIS — Z5941 Food insecurity: Secondary | ICD-10-CM

## 2022-07-11 DIAGNOSIS — D649 Anemia, unspecified: Secondary | ICD-10-CM

## 2022-07-11 LAB — POCT GLYCOSYLATED HEMOGLOBIN (HGB A1C)
HbA1c POC (<> result, manual entry): 7.3 % (ref 4.0–5.6)
HbA1c, POC (controlled diabetic range): 7.3 % — AB (ref 0.0–7.0)
HbA1c, POC (prediabetic range): 7.3 % — AB (ref 5.7–6.4)
Hemoglobin A1C: 7.3 % — AB (ref 4.0–5.6)

## 2022-07-11 LAB — CBC
HCT: 28.4 % — ABNORMAL LOW (ref 39.0–52.0)
Hemoglobin: 9.1 g/dL — ABNORMAL LOW (ref 13.0–17.0)
MCHC: 32.1 g/dL (ref 30.0–36.0)
MCV: 73.8 fl — ABNORMAL LOW (ref 78.0–100.0)
Platelets: 334 10*3/uL (ref 150.0–400.0)
RBC: 3.85 Mil/uL — ABNORMAL LOW (ref 4.22–5.81)
RDW: 18 % — ABNORMAL HIGH (ref 11.5–15.5)
WBC: 4.2 10*3/uL (ref 4.0–10.5)

## 2022-07-11 NOTE — Patient Instructions (Addendum)
It was great to see you!  Your medications should be ready at [facility]. You can increase your gabapentin to twice a day.   I am placing a referral to our social worker  Great job with your sugars!   Let's follow-up in 3 months, sooner if you have concerns.  If a referral was placed today, you will be contacted for an appointment. Please note that routine referrals can sometimes take up to 3-4 weeks to process. Please call our office if you haven't heard anything after this time frame.  Take care,  Vance Peper, NP

## 2022-07-11 NOTE — Assessment & Plan Note (Signed)
Will recheck CBC today. Will also give him more hemoccult cards to take home. Educated him that this is important to complete. Will place referral to GI if positive.

## 2022-07-11 NOTE — Assessment & Plan Note (Signed)
Chronic, stable. A1c is 7.3% today. Continue metformin '500mg'$  daily and farxiga '5mg'$  daily. Recommend that he schedule an eye exam. Follow-up in 3 months.

## 2022-07-11 NOTE — Assessment & Plan Note (Signed)
Chronic, stable. Continue gabapentin '100mg'$  BID.

## 2022-07-14 NOTE — Progress Notes (Signed)
Called and informed patient of results and provider instructions. Patient voiced understanding. Sw, cma

## 2022-07-17 ENCOUNTER — Other Ambulatory Visit: Payer: Self-pay

## 2022-07-17 ENCOUNTER — Other Ambulatory Visit (INDEPENDENT_AMBULATORY_CARE_PROVIDER_SITE_OTHER): Payer: Medicare PPO

## 2022-07-17 DIAGNOSIS — D509 Iron deficiency anemia, unspecified: Secondary | ICD-10-CM

## 2022-07-17 DIAGNOSIS — R195 Other fecal abnormalities: Secondary | ICD-10-CM

## 2022-07-17 LAB — POC HEMOCCULT BLD/STL (HOME/3-CARD/SCREEN)
Card #1 Date: 9212023
Fecal Occult Blood, POC: POSITIVE — AB

## 2022-07-17 NOTE — Progress Notes (Signed)
Per orders of Vance Peper, NP, pt dropped of stool hemoccult cards. Test was resulted in the clinic. Sw, cma

## 2022-07-17 NOTE — Addendum Note (Signed)
Addended by: Vance Peper A on: 07/17/2022 05:24 PM   Modules accepted: Orders

## 2022-07-18 NOTE — Progress Notes (Signed)
Called and informed patient of results and provider instructions. Patient voiced understanding. Sw, cma

## 2022-07-24 ENCOUNTER — Ambulatory Visit (INDEPENDENT_AMBULATORY_CARE_PROVIDER_SITE_OTHER): Payer: Medicare PPO | Admitting: Nurse Practitioner

## 2022-07-24 ENCOUNTER — Encounter: Payer: Self-pay | Admitting: Nurse Practitioner

## 2022-07-24 VITALS — BP 118/74 | HR 85 | Temp 96.9°F | Wt 126.6 lb

## 2022-07-24 DIAGNOSIS — R200 Anesthesia of skin: Secondary | ICD-10-CM

## 2022-07-24 MED ORDER — AMITRIPTYLINE HCL 10 MG PO TABS: 10.0000 mg | ORAL_TABLET | Freq: Every day | ORAL | 1 refills | Status: DC

## 2022-07-24 NOTE — Patient Instructions (Addendum)
It was great to see you!  Start amitriptyline 1 tablet at bedtime to help with the numbness. Keep your appointment with the neurologist 10/18.  Let's follow-up in 6 weeks, sooner if you have concerns.  If a referral was placed today, you will be contacted for an appointment. Please note that routine referrals can sometimes take up to 3-4 weeks to process. Please call our office if you haven't heard anything after this time frame.  Take care,  Vance Peper, NP

## 2022-07-24 NOTE — Progress Notes (Signed)
   Established Patient Office Visit  Subjective   Patient ID: Gary Gay, male    DOB: 1956/03/11  Age: 66 y.o. MRN: [id number]  Chief Complaint  Patient presents with   Acute Visit    Pt c/o numbness and soreness on entire RT side of body, pt states symptoms have worsen in the last week. 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 He states that his symptoms tend to be worse in the mornings and then get better in the evening.  They are still ongoing and bothersome.  He states that his right leg sometimes feels like a noodle.  He is taking gabapentin twice a day.  He denies chest pain, shortness of breath, confusion, slurred speech, weakness.    ROS See pertinent positives and negatives per HPI.    Objective:     BP 118/74 (BP Location: Left Arm, Patient Position: Sitting)   Pulse 85   Temp (!) 96.9 F (36.1 C) (Temporal)   Wt 126 lb 9.6 oz (57.4 kg)   SpO2 100%   BMI 19.83 kg/m  BP Readings from Last 3 Encounters:  07/24/22 118/74  07/11/22 100/80  05/08/22 138/88   Wt Readings from Last 3 Encounters:  07/24/22 126 lb 9.6 oz (57.4 kg)  07/11/22 124 lb 9.6 oz (56.5 kg)  05/08/22 129 lb 12.8 oz (58.9 kg)      Physical Exam  General: Alert and oriented x4 HEENT: normocephalic Pulmonary: Lungs clear to auscultation bilaterally  Heart: Regular rate and rhythm Neuro: Strength equal bilaterally, CN II-XII intact, no slurred speech.     Assessment & Plan:   Problem List Items Addressed This Visit       Other   Numbness - Primary    No red flags on exam.  Strength equal bilaterally, cranial nerves intact.  Continue gabapentin twice daily.  We will have him start amitriptyline 10 mg at bedtime.  He has a follow-up appointment with neurology on 08/13/2022, encouraged him to keep this appointment. Follow-up in 5 weeks after neurology  appointment.       Working with Education officer, museum and lead office admin to help get appointments and transportation scheduled for GI and ENT. 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   Return in about 6 weeks (around 09/04/2022) for numbness.    Charyl Dancer, NP

## 2022-07-24 NOTE — Assessment & Plan Note (Signed)
No red flags on exam.  Strength equal bilaterally, cranial nerves intact.  Continue gabapentin twice daily.  We will have him start amitriptyline 10 mg at bedtime.  He has a follow-up appointment with neurology on 08/13/2022, encouraged him to keep this appointment. Follow-up in 5 weeks after neurology appointment.

## 2022-07-29 DIAGNOSIS — K118 Other diseases of salivary glands: Secondary | ICD-10-CM | POA: Diagnosis not present

## 2022-07-30 ENCOUNTER — Ambulatory Visit: Payer: Self-pay

## 2022-07-30 NOTE — Patient Outreach (Unsigned)
  Care Coordination   Initial Visit Note   07/31/2022 Name: Gary Gay MRN: [id number] DOB: 03-10-1956  Gary Gay is a 66 y.o. year old male who sees McElwee, Lauren A, NP for primary care. I spoke with  Gary Gay by phone today.  What matters to the patients health and wellness today?  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 Patient has no other supports for medical appointments Discussed the patient is currently relying on his provider office to provide medical transportation Education provided on both [employer] and [place] Referral placed to [employer] via [place] - patient is aware he will receive a packet in the mail to complete Completed and submitted [place] the patient has received a disconnect notice scheduled for 07/31/22 Patient reports he owes approximately $2,895 to the utility company and is unable to make payments Patient has attempted to receive assistance from [employer] but was unsuccessful Assessed for patients ability to stay with his son or sister. Patient reports both family members have a full house and he does not want to ask to also stay there Referral  placed via [place] to resource navigators to research options for the patient Food Discussed the patient is unable to afford the cost of food; patient currently does have food in the home after receiving assistance from a local church Performed chart review to note patient weighed 134 during June OV and is now down to 126 Determined the patient often eats one meal per day in order to ration food Advised the patient SW would look into resource options to get food in the home          SDOH assessments and interventions completed:  Yes  SDOH Interventions Today    Flowsheet Row Most Recent Value  SDOH Interventions   Food Insecurity Interventions Other (Comment)  [awaiting options on MOW]  Transportation Interventions [id number] Referral, Other (Comment)  [TAMS, Senior Wheels]  Utilities Interventions [id number] Referral  [Referred to [place] Coordination Interventions Activated:  Yes  Care Coordination Interventions:  Yes, provided   Follow up plan:  SW will continue to follow    Encounter Outcome:  Pt. Visit Completed   Daneen Schick, Arita Miss, CDP Social Worker, Certified Dementia Practitioner [facility] Care Management  Care Coordination [phone]

## 2022-07-31 ENCOUNTER — Ambulatory Visit: Payer: Self-pay

## 2022-07-31 NOTE — Patient Instructions (Signed)
Visit Information  Thank you for taking time to visit with me today. Please don't hesitate to contact me if I can be of assistance to you.   Following are the goals we discussed today:   Goals Addressed             This Visit's Progress    Care Coordination Activities       Care Coordination Interventions: Collaboration with Assistant Clinical Director to get approval to arrange 4 weeks of mobile meals on behalf of the patient Outbound call placed to the patient to advise hot meals will be delivered via [place] to provide meals for 4 weeks Discussed plans for SW to contact the patient to advise of a start date once determined. - patient is aware meals may not begin until next week Advised the patient SW has been unable to locate a resource to cover the cost of utility payment - discussed patient will also not have heat due to having electric heat Discussed SW has placed a referral to [place] to determine if there are any resources to assist the patient SW placed an unsuccessful outbound call to [facility] to determine if utility assistance is available - voice message left SW placed an outbound call to [employer] - no funds available SW placed an outbound call to [place], spoke with [place] who advised funds are available but there is a cap of $600. Determined the patient would need to apply online via Epass Received confirmation patient meals would begin Monday 10/9 and end 11/3 Unsuccessful outbound call placed to the patient to advise of meal schedule and assist with logging into Epass - voice message left requesting a return call Successful call to DSS to place patient referral for energy assistance - a team member will contact patient directly within 48 hours Voice message left for the patient advising of above details          If you are experiencing a Mental Health or [place] or need someone to talk to, please  call 1-800-273-TALK (toll free, 24 hour hotline)   SW will continue to follow  Daneen Schick, Arita Miss, CDP Social Worker, Certified Dementia Practitioner [place] [phone]

## 2022-07-31 NOTE — Patient Outreach (Signed)
  Care Coordination   Follow Up Visit Note   07/31/2022 Name: Gary Gay MRN: [id number] DOB: 12-31-1955  Gary Gay is a 66 y.o. year old male who sees McElwee, Lauren A, NP for primary care. I spoke with  [name] by phone today.  What matters to the patients health and wellness today?  To obtain resources    Goals Addressed             This Visit's Progress    Care Coordination Activities       Care Coordination Interventions: Collaboration with Assistant Clinical Director to get approval to arrange 4 weeks of mobile meals on behalf of the patient Outbound call placed to the patient to advise hot meals will be delivered via [place] to provide meals for 4 weeks Discussed plans for SW to contact the patient to advise of a start date once determined. - patient is aware meals may not begin until next week Advised the patient SW has been unable to locate a resource to cover the cost of utility payment - discussed patient will also not have heat due to having electric heat Discussed SW has placed a referral to [place] to determine if there are any resources to assist the patient SW placed an unsuccessful outbound call to [facility] to determine if utility assistance is available - voice message left SW placed an outbound call to [employer] - no funds available SW placed an outbound call to [place], spoke with [place] who advised funds are available but there is a cap of $600. Determined the patient would need to apply online via Epass Received confirmation patient meals would begin Monday 10/9 and end 11/3 Unsuccessful outbound call placed to the patient to advise of meal schedule and assist with logging into Epass - voice message left requesting a return call Successful call to DSS to place patient referral for energy assistance - a team member will contact patient directly within 48 hours Voice message left for the patient  advising of above details          SDOH assessments and interventions completed:  No  SDOH Interventions Today    Flowsheet Row Most Recent Value  SDOH Interventions   Food Insecurity Interventions Other (Comment)  [4 weeks of mobile meals]        Care Coordination Interventions Activated:  Yes  Care Coordination Interventions:  Yes, provided   Follow up plan:  SW will continue to follow    Encounter Outcome:  Pt. Visit Completed   Nadara Eaton, CDP Social Worker, Certified Dementia Practitioner [place] Coordination [phone]

## 2022-07-31 NOTE — Patient Instructions (Signed)
Visit Information  Thank you for taking time to visit with me today. Please don't hesitate to contact me if I can be of assistance to you.   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 Patient has no other supports for medical appointments Discussed the patient is currently relying on his provider office to provide medical transportation Education provided on both Senior Wheels and [place]) Referral placed to [employer] via [place] - patient is aware he will receive a packet in the mail to complete Completed and submitted [place] the patient has received a disconnect notice scheduled for 07/31/22 Patient reports he owes approximately $2,895 to the utility company and is unable to make payments Patient has attempted to receive assistance from [employer] but was unsuccessful Assessed for patients ability to stay with his son or sister. Patient reports both family members have a full house and he does not want to ask to also stay there Referral placed via [place] to resource navigators to research options for the patient Food Discussed the patient is unable to afford the cost of  food; patient currently does have food in the home after receiving assistance from a local church Performed chart review to note patient weighed 134 during June OV and is now down to 126 Determined the patient often eats one meal per day in order to ration food Advised the patient SW would look into resource options to get food in the home          If you are experiencing a [place] or [place] or need someone to talk to, please go to [facility] [place] [phone])  The patient verbalized understanding of instructions, educational materials, and care plan provided today and DECLINED offer to receive copy of patient instructions, educational materials, and care plan.   SW will continue to follow  Daneen Schick, BSW, CDP Social Worker, Certified Dementia Practitioner [place] Management  Care Coordination [phone]

## 2022-08-04 ENCOUNTER — Ambulatory Visit: Payer: Self-pay

## 2022-08-04 NOTE — Patient Instructions (Signed)
Visit Information  Thank you for taking time to visit with me today. Please don't hesitate to contact me if I can be of assistance to you.   Following are the goals we discussed today:   Goals Addressed             This Visit's Progress    Care Coordination Activities       Care Coordination Interventions: Confirmed receipt of first mobile meals delivery Reviewed meals will be delivered Monday- Friday once per day for four weeks Discussed the patient was contacted by DSS and did complete an e-pass application for financial assistance Determined the patients power has not yet been cut off but he is unsure when it will be as his disconnect notice stated it would be turned off on Thursday 10/5 Performed chart review to note patient has two upcoming appointments scheduled next week - patient reports he will ask his niece to transport him Collaboration with [facility] to determine if patients application has been approved          If you are experiencing a [place] or [place] or need someone to talk to, please call 1-800-273-TALK (toll free, 24 hour hotline)  The patient verbalized understanding of instructions, educational materials, and care plan provided today and DECLINED offer to receive copy of patient instructions, educational materials, and care plan.   SW will continue to follow  Daneen Schick, BSW, CDP Social Worker, Certified Dementia Practitioner [place] Management  Care Coordination [phone]

## 2022-08-04 NOTE — Patient Outreach (Signed)
  Care Coordination   Follow Up Visit Note   08/04/2022 Name: Gary Gay MRN: [id number] DOB: 1956/08/19  Gary Gay is a 66 y.o. year old male who sees McElwee, Lauren A, NP for primary care. I spoke with  [name] by phone today.  What matters to the patients health and wellness today?  To obtain resources for utility bill    Goals Addressed             This Visit's Progress    Care Coordination Activities       Care Coordination Interventions: Confirmed receipt of first mobile meals delivery Reviewed meals will be delivered Monday- Friday once per day for four weeks Discussed the patient was contacted by DSS and did complete an e-pass application for financial assistance Determined the patients power has not yet been cut off but he is unsure when it will be as his disconnect notice stated it would be turned off on Thursday 10/5 Performed chart review to note patient has two upcoming appointments scheduled next week - patient reports he will ask his niece to transport him Collaboration with [facility] to determine if patients application has been approved          SDOH assessments and interventions completed:  No     Care Coordination Interventions Activated:  Yes  Care Coordination Interventions:  Yes, provided   Follow up plan:  SW will continue to follow    Encounter Outcome:  Pt. Visit Completed   Daneen Schick, Arita Miss, CDP Social Worker, Certified Dementia Practitioner [facility] Care Management  Care Coordination [phone]

## 2022-08-07 ENCOUNTER — Ambulatory Visit: Payer: Self-pay

## 2022-08-07 NOTE — Patient Outreach (Signed)
  Care Coordination   Follow Up Visit Note   08/07/2022 Name: Gurtaj Ruz MRN: [id number] DOB: 1956-05-26  Chanan Detwiler is a 66 y.o. year old male who sees McElwee, Lauren A, NP for primary care. I  attempted to contact the patient to follow up on goal progression.  What matters to the patients health and wellness today?  Unable to reach patient    Goals Addressed             This Visit's Progress    Care Coordination Activities       Care Coordination Interventions: Collaboration with Manuella Ghazi from [facility] who confirms patients application has been approved. Mrs. Pearson Forster indicates she has spoken with the patient and did schedule transportation to 10/18 appointment Unsuccessful call placed to the patient to follow up on status of utility bill; voice message left requesting a return call Mailed the patient information on [facility] for review        SDOH assessments and interventions completed:  No     Care Coordination Interventions Activated:  Yes  Care Coordination Interventions:  Yes, provided   Follow up plan:  SW will continue to follow    Encounter Outcome:  No Answer   Daneen Schick, BSW, CDP Social Worker, Certified Dementia Practitioner [facility] Care Management  Care Coordination [phone]

## 2022-08-08 ENCOUNTER — Telehealth: Payer: Self-pay | Admitting: Nurse Practitioner

## 2022-08-08 NOTE — Telephone Encounter (Signed)
Left message for patient to call back and schedule Medicare Annual Wellness Visit (AWV).   Please offer to do virtually or by telephone.  Left office number and my jabber #[phone].  AWVI eligible as of 06/27/2022  Please schedule at anytime with Nurse Health Advisor.  

## 2022-08-11 ENCOUNTER — Ambulatory Visit: Payer: Self-pay

## 2022-08-11 NOTE — Progress Notes (Signed)
NEUROLOGY FOLLOW UP OFFICE NOTE  Gary Gay [id number]  Assessment/Plan:   Left thalamic stroke, likely secondary to small vessel disease Type 2 diabetes mellitus, uncontrolled Cigarette smoker Hypertension Hyperlipidemia Parotid gland mass, incidental finding   1   Discontinue Plavix.  Continue ASA 81mg  daily 2.  Secondary stroke prevention as managed by PCP:             - Hgb A1c goal less than 7             - Statin.  LDL goal less than 70             - Normotensive blood pressure 3.  Mediterranean diet and routine exercise. 4  Smoking cessation 5  Patient states he was not referred to ENT regarding the parotid mass.  Advised to follow up with PCP about referral to ENT 6  Follow up 6 months     Subjective:  Gary Gay is a 66 year old right-handed male with DM II and HTN and cigarette smoker who follows up for thalamic stroke.  UPDATE Current medications:  ASA 81mg  daily, Plavix 75mg  daily, atorvastatin 80mg , losartan, metformin, amitriptyline 10mg  QHS  Still with right sided upper and lower extremity numbness.  Also arm and leg feels "puffy" or sore.  No associated pain.     HISTORY: He presented to [facility] on 02/24/2022 for 5 day history of right sided facial, arm and leg numbness.  Noted weakness in the right hand, would drop objects.  Sometimes would drag his right foot.  He also endorsed swelling and pain of the right leg as well.  CT head showed small age-indeterminate left parathalamic infarct.  Follow up MRI of brain confirmed acute lacunar infarct in the left thalamus.  CTA of head and neck showed 60% left ICA origin stenosis and severe right vertebral artery origin stenosis but no major intracranial LVO or significant stenosis.  TTE showed EF 60-65% with no obvious source of emboli.  Prior blood work from 01/16/2022 demonstrated LDL 107 and Hgb A1c of 12.8.  He was reportedly taking ASA but not daily.  He was discharged on ASA 81mg  and Plavix 75mg  DAPT  and started on atorvastatin 80mg  daily.  Venous doppler of right leg negative for DVT.  He had an MRI of lumbar spine which showed moderate stenosis at L4-5 and L5-S1.  Imaging revealed parotid mass.  ENT referral recommended.     Still feels numb in right arm and leg.  Leg still feels swollen.  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   PAST MEDICAL HISTORY: Past Medical History:  Diagnosis Date   Diabetes mellitus     MEDICATIONS: Current Outpatient Medications on File Prior to Visit  Medication Sig Dispense Refill   acetaminophen (TYLENOL) 325 MG tablet Take 650 mg by mouth every 6 (six) hours as needed for mild pain. (Patient not taking: Reported on 04/10/2022)     amitriptyline (ELAVIL) 10 MG tablet Take 1 tablet (10 mg total) by mouth at bedtime. 30 tablet 1   aspirin EC 81 MG tablet TAKE ONE TABLET BY MOUTH AS DIRECTED BY YOUR MEDICAL PROVIDER     atorvastatin (LIPITOR) 80 MG tablet Take 1 tablet (80 mg total) by mouth daily. 90 tablet 1   clopidogrel (PLAVIX) 75 MG tablet Take 1 tablet (75 mg total) by mouth daily. 90 tablet 1   gabapentin (NEURONTIN) 100 MG capsule Take 1  capsule (100 mg total) by mouth 2 (two) times daily. 180 capsule 1   losartan (COZAAR) 25 MG tablet Take 1 tablet (25 mg total) by mouth daily. 45 tablet 2   metFORMIN (GLUCOPHAGE-XR) 500 MG 24 hr tablet TAKE 1 TABLET(500 MG) BY MOUTH DAILY WITH BREAKFAST 90 tablet 2   No current facility-administered medications on file prior to visit.    ALLERGIES: No Known Allergies  FAMILY HISTORY: Family History  Problem Relation Age of Onset   Cancer Sister        pancreatic   Diabetes Maternal Grandmother    Stroke Neg Hx       Objective:  Blood pressure (!) 146/77, pulse 75, resp. rate 18, height 5\' 7"  (1.702 m), weight 126 lb (57.2 kg), SpO2 99 %. General: No acute distress.  Patient appears well-groomed.   Head:  Normocephalic/atraumatic Eyes:  Fundi examined but  not visualized Neck: supple, no paraspinal tenderness, full range of motion Heart:  Regular rate and rhythm Lungs:  Clear to auscultation bilaterally Back: No paraspinal tenderness Neurological Exam: alert and oriented to person, place, and time.  Speech fluent and not dysarthric, language intact.  CN II-XII intact. Bulk and tone normal, muscle strength 5/5 throughout.  Sensation to pinprick and vibration intact.  Deep tendon reflexes 2+ throughout, toes downgoing.  Finger to nose testing with trace slowness on right.  Cautious gait.  Uses cane.  Romberg negative.   Gary Millet, DO  CC: Rodman Pickle, NP
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
## 2022-08-11 NOTE — Patient Outreach (Signed)
  Care Coordination   08/11/2022 Name: Gary Gay MRN: [id number] DOB: 22-Jul-1956   Care Coordination Outreach Attempts:  A second unsuccessful outreach was attempted today to offer the patient with information about available care coordination services as a benefit of their health plan.     Follow Up Plan:  Additional outreach attempts will be made to offer the patient care coordination information and services.   Encounter Outcome:  No Answer  Care Coordination Interventions Activated:  No   Care Coordination Interventions:  No, not indicated    Daneen Schick, BSW, CDP Social Worker, Certified Dementia Practitioner [facility] Care Management  Care Coordination [phone]

## 2022-08-13 ENCOUNTER — Telehealth (INDEPENDENT_AMBULATORY_CARE_PROVIDER_SITE_OTHER): Payer: Medicare PPO | Admitting: Neurology

## 2022-08-13 ENCOUNTER — Encounter: Payer: Self-pay | Admitting: Neurology

## 2022-08-13 VITALS — BP 146/77 | HR 75 | Resp 18 | Ht 67.0 in | Wt 126.0 lb

## 2022-08-13 DIAGNOSIS — Z794 Long term (current) use of insulin: Secondary | ICD-10-CM

## 2022-08-13 DIAGNOSIS — I10 Essential (primary) hypertension: Secondary | ICD-10-CM

## 2022-08-13 DIAGNOSIS — E785 Hyperlipidemia, unspecified: Secondary | ICD-10-CM | POA: Diagnosis not present

## 2022-08-13 DIAGNOSIS — I6381 Other cerebral infarction due to occlusion or stenosis of small artery: Secondary | ICD-10-CM

## 2022-08-13 DIAGNOSIS — E1165 Type 2 diabetes mellitus with hyperglycemia: Secondary | ICD-10-CM

## 2022-08-13 DIAGNOSIS — F17200 Nicotine dependence, unspecified, uncomplicated: Secondary | ICD-10-CM

## 2022-08-13 NOTE — Patient Instructions (Signed)
Stop clopidogrel (Plavix).  Continue aspirin '81mg'$  daily Continue atorvastatin, blood pressure medication and metformin as per your PCP Mediterranean diet Smoking cessation Follow up in 6 months   Mediterranean Diet A Mediterranean diet refers to food and lifestyle choices that are based on the traditions of countries located on the [employer]. It focuses on eating more fruits, vegetables, whole grains, beans, nuts, seeds, and heart-healthy fats, and eating less dairy, meat, eggs, and processed foods with added sugar, salt, and fat. This way of eating has been shown to help prevent certain conditions and improve outcomes for people who have chronic diseases, like kidney disease and heart disease. What are tips for following this plan? Reading food labels Check the serving size of packaged foods. For foods such as rice and pasta, the serving size refers to the amount of cooked product, not dry. Check the total fat in packaged foods. Avoid foods that have saturated fat or trans fats. Check the ingredient list for added sugars, such as corn syrup. Shopping  Buy a variety of foods that offer a balanced diet, including: Fresh fruits and vegetables (produce). Grains, beans, nuts, and seeds. Some of these may be available in unpackaged forms or large amounts (in bulk). Fresh seafood. Poultry and eggs. Low-fat dairy products. Buy whole ingredients instead of prepackaged foods. Buy fresh fruits and vegetables in-season from local farmers markets. Buy plain frozen fruits and vegetables. If you do not have access to quality fresh seafood, buy precooked frozen shrimp or canned fish, such as tuna, salmon, or sardines. Stock your pantry so you always have certain foods on hand, such as olive oil, canned tuna, canned tomatoes, rice, pasta, and beans. Cooking Cook foods with extra-virgin olive oil instead of using butter or other vegetable oils. Have meat as a side dish, and have vegetables or  grains as your main dish. This means having meat in small portions or adding small amounts of meat to foods like pasta or stew. Use beans or vegetables instead of meat in common dishes like chili or lasagna. Experiment with different cooking methods. Try roasting, broiling, steaming, and sauting vegetables. Add frozen vegetables to soups, stews, pasta, or rice. Add nuts or seeds for added healthy fats and plant protein at each meal. You can add these to yogurt, salads, or vegetable dishes. Marinate fish or vegetables using olive oil, lemon juice, garlic, and fresh herbs. Meal planning Plan to eat one vegetarian meal one day each week. Try to work up to two vegetarian meals, if possible. Eat seafood two or more times a week. Have healthy snacks readily available, such as: Vegetable sticks with hummus. Greek yogurt. Fruit and nut trail mix. Eat balanced meals throughout the week. This includes: Fruit: 2-3 servings a day. Vegetables: 4-5 servings a day. Low-fat dairy: 2 servings a day. Fish, poultry, or lean meat: 1 serving a day. Beans and legumes: 2 or more servings a week. Nuts and seeds: 1-2 servings a day. Whole grains: 6-8 servings a day. Extra-virgin olive oil: 3-4 servings a day. Limit red meat and sweets to only a few servings a month. Lifestyle  Cook and eat meals together with your family, when possible. Drink enough fluid to keep your urine pale yellow. Be physically active every day. This includes: Aerobic exercise like running or swimming. Leisure activities like gardening, walking, or housework. Get 7-8 hours of sleep each night. If recommended by your health care provider, drink red wine in moderation. This means 1 glass a day for nonpregnant  women and 2 glasses a day for men. A glass of wine equals 5 oz (150 mL). What foods should I eat? Fruits Apples. Apricots. Avocado. Berries. Bananas. Cherries. Dates. Figs. Grapes. Lemons. Melon. Oranges. Peaches. Plums.  Pomegranate. Vegetables Artichokes. Beets. Broccoli. Cabbage. Carrots. Eggplant. Green beans. Chard. Kale. Spinach. Onions. Leeks. Peas. Squash. Tomatoes. Peppers. Radishes. Grains Whole-grain pasta. Brown rice. Bulgur wheat. Polenta. Couscous. Whole-wheat bread. [place]. Meats and other proteins Beans. Almonds. Sunflower seeds. Pine nuts. Peanuts. [place]. Salmon. Scallops. Shrimp. [place]. Tilapia. Clams. Oysters. Eggs. Poultry without skin. Dairy Low-fat milk. Cheese. Greek yogurt. Fats and oils Extra-virgin olive oil. Avocado oil. Grapeseed oil. Beverages Water. Red wine. Herbal tea. Sweets and desserts Greek yogurt with honey. Baked apples. Poached pears. Trail mix. Seasonings and condiments Basil. Cilantro. Coriander. Cumin. Mint. Parsley. Sage. Rosemary. Tarragon. Garlic. Oregano. Thyme. Pepper. Balsamic vinegar. Tahini. Hummus. Tomato sauce. Olives. Mushrooms. The items listed above may not be a complete list of foods and beverages you can eat. Contact a dietitian for more information. What foods should I limit? This is a list of foods that should be eaten rarely or only on special occasions. Fruits Fruit canned in syrup. Vegetables Deep-fried potatoes (french fries). Grains Prepackaged pasta or rice dishes. Prepackaged cereal with added sugar. Prepackaged snacks with added sugar. Meats and other proteins Beef. Pork. Lamb. Poultry with skin. Hot dogs. [place] Salines. Dairy Ice cream. Sour cream. Whole milk. Fats and oils Butter. Canola oil. Vegetable oil. Beef fat (tallow). Lard. Beverages Juice. Sugar-sweetened soft drinks. Beer. Liquor and spirits. Sweets and desserts Cookies. Cakes. Pies. Candy. Seasonings and condiments Mayonnaise. Pre-made sauces and marinades. The items listed above may not be a complete list of foods and beverages you should limit. Contact a dietitian for more information. Summary The Mediterranean diet includes both food and lifestyle choices. Eat a  variety of fresh fruits and vegetables, beans, nuts, seeds, and whole grains. Limit the amount of red meat and sweets that you eat. If recommended by your health care provider, drink red wine in moderation. This means 1 glass a day for nonpregnant women and 2 glasses a day for men. A glass of wine equals 5 oz (150 mL). This information is not intended to replace advice given to you by your health care provider. Make sure you discuss any questions you have with your health care provider. Document Revised: 11/18/2019 Document Reviewed: 09/15/2019 Elsevier Patient Education  [place].

## 2022-08-14 ENCOUNTER — Telehealth: Payer: Self-pay

## 2022-08-14 ENCOUNTER — Ambulatory Visit: Payer: Self-pay

## 2022-08-14 NOTE — Patient Outreach (Signed)
  Care Coordination   Follow Up Visit Note   08/14/2022 Name: Aldan Camey MRN: [id number] DOB: 1956/05/26  Goldman Birchall is a 66 y.o. year old male who sees McElwee, Lauren A, NP for primary care. I spoke with  [name] by phone today.  What matters to the patients health and wellness today?  To obtain electricity in the home    Goals Addressed             This Visit's Progress    COMPLETED: Care Coordination Activities       Care Coordination Interventions: Determined the patient no longer wants to use [facility] since the pick up time is so early. Patient indicates he will have his son transport him to his appointment tomorrow Ride request cancelled Advised the patient he will be contacted by a nurse case manager over the next two weeks to complete a clinical assessment Discussed SW has been unable to locate any new resources to assist with utility costs at this time        SDOH assessments and interventions completed:  No     Care Coordination Interventions Activated:  Yes  Care Coordination Interventions:  Yes, provided   Follow up plan: Referral made to RN Care Manager    Encounter Outcome:  Pt. Visit Completed   Daneen Schick, Arita Miss, CDP Social Worker, Certified Dementia Practitioner [facility] Care Management  Care Coordination [phone]

## 2022-08-14 NOTE — Patient Instructions (Signed)
Visit Information  Thank you for taking time to visit with me today. Please don't hesitate to contact me if I can be of assistance to you.   Following are the goals we discussed today:   Goals Addressed             This Visit's Progress    COMPLETED: Care Coordination Activities       Care Coordination Interventions: Determined the patient no longer wants to use [facility] since the pick up time is so early. Patient indicates he will have his son transport him to his appointment tomorrow Ride request cancelled Advised the patient he will be contacted by a nurse case manager over the next two weeks to complete a clinical assessment Discussed SW has been unable to locate any new resources to assist with utility costs at this time       If you are experiencing a Mental Health or [place] or need someone to talk to, please go to [facility] [place] [phone])  The patient verbalized understanding of instructions, educational materials, and care plan provided today and DECLINED offer to receive copy of patient instructions, educational materials, and care plan.   Daneen Schick, BSW, CDP Social Worker, Certified Dementia Practitioner [place] [facility]  Care Coordination [phone]
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
## 2022-08-15 ENCOUNTER — Telehealth: Payer: Self-pay

## 2022-08-15 ENCOUNTER — Ambulatory Visit: Payer: Medicare PPO | Admitting: Gastroenterology

## 2022-08-15 NOTE — Patient Outreach (Signed)
  Care Coordination   08/15/2022 Name: Gary Gay MRN: [id number] DOB: 1956-09-01   Care Coordination Outreach Attempts:  An unsuccessful telephone outreach was attempted today to offer the patient information about available care coordination services as a benefit of their health plan.   Follow Up Plan:  Additional outreach attempts will be made to offer the patient care coordination information and services.   Encounter Outcome:  No Answer  Care Coordination Interventions Activated:  No   Care Coordination Interventions:  No, not indicated    [name], RN, MSN [facility] Care Management Care Management Coordinator Direct Line [phone]

## 2022-08-19 ENCOUNTER — Telehealth: Payer: Self-pay

## 2022-08-19 NOTE — Patient Outreach (Signed)
  Care Coordination   Initial Visit Note   08/19/2022 Name: Gary Gay MRN: [id number] DOB: 1956/02/27  Gary Gay is a 66 y.o. year old male who sees Gay, Gary A, NP for primary care. I spoke with  Gary Gay by phone today.  What matters to the patients health and wellness today?  Getting Electricity back on.    Goals Addressed             This Visit's Progress    Care Coordination Activities-Getting electricity turned back on       Care Coordination Interventions: Patient electricity currently off.  Patient states he has $1400 to go toward his electricity.  He states he called [employer] on 08/18/22 and got nowhere with them Notified Gary Gay, Social worker of current information. Patient unable to stay with relatives at this time.   He is managing with the meals being delivered daily but this is the only complete meal he is eating per day.            SDOH assessments and interventions completed:  Yes     Care Coordination Interventions Activated:  Yes  Care Coordination Interventions:  Yes, provided   Follow up plan: Follow up call scheduled for 08/22/22    Encounter Outcome:  Pt. Visit Completed   [name], RN, MSN [place] [facility] Care Management Coordinator Direct Line [phone]

## 2022-08-19 NOTE — Patient Outreach (Signed)
  Care Coordination   08/19/2022 Name: Gary Gay MRN: [id number] DOB: 01-Jul-1956   Care Coordination Outreach Attempts:  A second unsuccessful outreach was attempted today to offer the patient with information about available care coordination services as a benefit of their health plan.     Follow Up Plan:  Additional outreach attempts will be made to offer the patient care coordination information and services.   Encounter Outcome:  No Answer  Care Coordination Interventions Activated:  No   Care Coordination Interventions:  No, not indicated    [name], RN, MSN [facility] Care Management Care Management Coordinator Direct Line [phone]

## 2022-08-19 NOTE — Patient Instructions (Signed)
Visit Information  Thank you for taking time to visit with me today. Please don't hesitate to contact me if I can be of assistance to you.   Following are the goals we discussed today:   Goals Addressed             This Visit's Progress    Care Coordination Activities-Getting electricity turned back on       Care Coordination Interventions: Patient electricity currently off.  Patient states he has $1400 to go toward his electricity.  He states he called [employer] on 08/18/22 and got nowhere with them Notified [name], Social worker of current information. Patient unable to stay with relatives at this time.   He is managing with the meals being delivered daily but this is the only complete meal he is eating per day.            Our next appointment is by telephone on 08/22/22 at 11am  Please call the care guide team at [phone] if you need to cancel or reschedule your appointment.   If you are experiencing a Mental Health or [place] or need someone to talk to, please call the Suicide and Crisis Lifeline: 988   The patient verbalized understanding of instructions, educational materials, and care plan provided today and DECLINED offer to receive copy of patient instructions, educational materials, and care plan.   Telephone follow up appointment with care management team member scheduled for:07/2722  [name], RN, MSN [place] Management Care Management Coordinator Direct Line [phone]

## 2022-08-21 ENCOUNTER — Telehealth: Payer: Self-pay

## 2022-08-21 NOTE — Patient Outreach (Signed)
  Care Coordination   Multidisciplinary Case Review Note    08/21/2022 Name: Gary Gay MRN: [id number] DOB: 1956/06/10  Gary Gay is a 66 y.o. year old male who sees McElwee, Lauren A, NP for primary care.  The  multidisciplinary care team met today to review patient care needs and barriers.    08/21/22 Gary Gay Spoke with patient on 08/19/22. Patient states he has $1400 toward his [employer] that he borrowed.  He called [employer] on 08/18/22 and was not able to successfully arrange to make a partial payment or payment arrangements to get power back on. He is sleeping in his sister's car outside the home at this time to stay warm.  He has no family that he can stay with at this time.  He states there was a church that states they could not help him until Nov. 1st.  Advised him to call them back on Nov. 1st.  He is receiving MOW meals daily that he is eating but still missing others meals during the day.  He states he will not get his social security back until May 2024 but receives state pension of about $300.  Bianca to reach out to pt.pt    Goals Addressed   None     SDOH assessments and interventions completed:  Yes     Care Coordination Interventions Activated:  Yes   Care Coordination Interventions:  Yes, provided   Follow up plan:  [name], MSW to follow up  with patient pertaining to electric bill.  Jon Billings, RN follow up scheduled for 08/22/22    Multidisciplinary Team Attendees:  Daneen Schick BSW, Lazaro Arms, RN; Jon Billings, RN and Eduard Clos, LCSW, [name], MSW  Scribe for Multidisciplinary Case Review:  Rolinda Roan  [name], RN, MSN [facility] Care Management Care Management Coordinator Direct Line [phone]

## 2022-08-22 ENCOUNTER — Telehealth: Payer: Self-pay

## 2022-08-22 NOTE — Patient Instructions (Signed)
Visit Information  Thank you for taking time to visit with me today. Please don't hesitate to contact me if I can be of assistance to you.   Following are the goals we discussed today:   Goals Addressed             This Visit's Progress    Care Coordination Activities-Getting electricity turned back on       Care Coordination Interventions: Patient electricity currently off.  Patient states he has $1400 to go toward his electricity.  He states he called [employer] on 08/18/22 and got nowhere with them MSW [name] to outreach patient  Patient unable to stay with relatives at this time.   No medical needs presently. Patient taking medications as prescribed.  He is managing with the meals being delivered daily but this is the only complete meal he is eating per day.            Our next appointment is by telephone on 09/11/22  at 1030am  Please call the care guide team at [phone] if you need to cancel or reschedule your appointment.   If you are experiencing a Mental Health or [place] or need someone to talk to, please call the Suicide and Crisis Lifeline: 988   The patient verbalized understanding of instructions, educational materials, and care plan provided today and agreed to receive a mailed copy of patient instructions, educational materials, and care plan.   Telephone follow up appointment with care management team member scheduled for: November  Demetrius Mahler J Stewart Pimenta, RN, MSN [place] [facility] Care Management Coordinator Direct Line [phone]

## 2022-08-22 NOTE — Patient Outreach (Signed)
  Care Coordination   Follow Up Visit Note   08/22/2022 Name: Bernell Sigal MRN: [id number] DOB: 04/04/1956  Khian Remo is a 66 y.o. year old male who sees McElwee, Lauren A, NP for primary care. I spoke with  [name] by phone today.  What matters to the patients health and wellness today?  Getting Electricity back on    Goals Addressed             This Visit's Progress    Care Coordination Activities-Getting electricity turned back on       Care Coordination Interventions: Patient electricity currently off.  Patient states he has $1400 to go toward his electricity.  He states he called [employer] on 08/18/22 and got nowhere with them MSW [name] to outreach patient  Patient unable to stay with relatives at this time.   No medical needs presently. Patient taking medications as prescribed.  He is managing with the meals being delivered daily but this is the only complete meal he is eating per day.            SDOH assessments and interventions completed:  Yes     Care Coordination Interventions Activated:  Yes  Care Coordination Interventions:  Yes, provided   Follow up plan: Follow up call scheduled for November    Encounter Outcome:  Pt. Visit Completed   [name], RN, MSN [place] [facility] Care Management Coordinator Direct Line [phone]

## 2022-08-29 ENCOUNTER — Ambulatory Visit: Payer: Self-pay | Admitting: Licensed Clinical Social Worker

## 2022-08-29 NOTE — Patient Outreach (Signed)
  Care Coordination   08/29/2022 Name: Gary Gay MRN: [id number] DOB: May 15, 1956   Care Coordination Outreach Attempts:  An unsuccessful telephone outreach was attempted today to offer the patient information about available care coordination services as a benefit of their health plan.   Follow Up Plan:  Additional outreach attempts will be made to offer the patient care coordination information and services.   Encounter Outcome:  No Answer  Care Coordination Interventions Activated:  No   Care Coordination Interventions:  No, not indicated    [name], BSW, MSW, [place]  Social Worker [facility] Care Management  [phone]

## 2022-09-02 ENCOUNTER — Ambulatory Visit: Payer: Self-pay | Admitting: Licensed Clinical Social Worker

## 2022-09-02 ENCOUNTER — Telehealth: Payer: Self-pay | Admitting: Nurse Practitioner

## 2022-09-02 NOTE — Patient Outreach (Signed)
  Care Coordination   09/02/2022 Name: Gary Gay MRN: [id number] DOB: 1956/09/02   Care Coordination Outreach Attempts:  A second unsuccessful outreach was attempted today to offer the patient with information about available care coordination services as a benefit of their health plan.     Follow Up Plan:  Additional outreach attempts will be made to offer the patient care coordination information and services.  SW will attempt patient again on Monday 11/13'@1'$ :15pm  Encounter Outcome:  No Answer  Care Coordination Interventions Activated:  No   Care Coordination Interventions:  No, not indicated    [name], Arita Miss, MSW, [place]  Social Worker [facility] Care Management  [phone]

## 2022-09-02 NOTE — Telephone Encounter (Signed)
Pt called and stated he missed your call . Please call him back

## 2022-09-03 NOTE — Progress Notes (Unsigned)
   Established Patient Office Visit  Subjective   Patient ID: Gary Gay, male    DOB: [birth year]  Age: 66 y.o. MRN: [id number]  No chief complaint on file.   HPI  Gary Gay is here to follow-up on numbness and tingling in his right arm and leg after stroke. He saw neurology recently and stopped his plavix and he is just taking aspirin at this point daily. Last visit he was started on amitriptyline '10mg'$  daily at bedtime.   He has also been noted to have iron deficiency anemia on labs with a positive hemoccult. He was scheduled with GI on 08/15/22, however it is noted he didn't show up for this appointment.   {History (Optional):23778}  ROS    Objective:     There were no vitals taken for this visit. {Vitals History (Optional):23777}  Physical Exam   No results found for any visits on 09/04/22.  {Labs (Optional):23779}  The ASCVD Risk score (Arnett DK, et al., 2019) failed to calculate for the following reasons:   The patient has a prior MI or stroke diagnosis    Assessment & Plan:   Problem List Items Addressed This Visit   None   No follow-ups on file.    Charyl Dancer, NP

## 2022-09-04 ENCOUNTER — Telehealth: Payer: Self-pay

## 2022-09-04 ENCOUNTER — Encounter: Payer: Self-pay | Admitting: Nurse Practitioner

## 2022-09-04 ENCOUNTER — Ambulatory Visit (INDEPENDENT_AMBULATORY_CARE_PROVIDER_SITE_OTHER): Payer: Medicare PPO | Admitting: Nurse Practitioner

## 2022-09-04 VITALS — BP 140/92 | HR 94 | Temp 97.8°F | Ht 67.0 in | Wt 129.4 lb

## 2022-09-04 DIAGNOSIS — Z5941 Food insecurity: Secondary | ICD-10-CM

## 2022-09-04 DIAGNOSIS — D649 Anemia, unspecified: Secondary | ICD-10-CM | POA: Diagnosis not present

## 2022-09-04 DIAGNOSIS — Z8673 Personal history of transient ischemic attack (TIA), and cerebral infarction without residual deficits: Secondary | ICD-10-CM

## 2022-09-04 LAB — CBC
HCT: 30 % — ABNORMAL LOW (ref 39.0–52.0)
Hemoglobin: 9.4 g/dL — ABNORMAL LOW (ref 13.0–17.0)
MCHC: 31.5 g/dL (ref 30.0–36.0)
MCV: 75.4 fl — ABNORMAL LOW (ref 78.0–100.0)
Platelets: 436 10*3/uL — ABNORMAL HIGH (ref 150.0–400.0)
RBC: 3.98 Mil/uL — ABNORMAL LOW (ref 4.22–5.81)
RDW: 20.5 % — ABNORMAL HIGH (ref 11.5–15.5)
WBC: 4.6 10*3/uL (ref 4.0–10.5)

## 2022-09-04 MED ORDER — IRON (FERROUS SULFATE) 325 (65 FE) MG PO TABS: 325.0000 mg | ORAL_TABLET | Freq: Every day | ORAL | 2 refills | Status: DC

## 2022-09-04 NOTE — Patient Outreach (Signed)
  Care Coordination   Multidisciplinary Case Review Note    09/04/2022 Name: Gary Gay MRN: [id number] DOB: [birth year]  Gary Gay is a 66 y.o. year old male who sees McElwee, Lauren A, NP for primary care.  The  multidisciplinary care team met today to review patient care needs and barriers.  Patient missed 2 called with SW, [name].  Next call scheduled for 09/08/22.     Goals Addressed   None     SDOH assessments and interventions completed:  Yes     Care Coordination Interventions Activated:  Yes   Care Coordination Interventions:  Yes, provided   Follow up plan: Follow up call scheduled for 09/08/22    Multidisciplinary Team Attendees:   Lazaro Arms, RN; Jon Billings, RN and Eduard Clos, LCSW, Johnney Killian, RN  Scribe for Multidisciplinary Case Review:  Jon Billings, RN  [name], RN, MSN [place] [facility] Care Management Coordinator Direct Line [phone]

## 2022-09-04 NOTE — Assessment & Plan Note (Signed)
He missed his appointment with GI due to not being able to find the office. I gave him the address and phone number to call and re-schedule this appointment. Recommend that he start an iron tablet '325mg'$  daily with food. Will check CBC today.

## 2022-09-04 NOTE — Telephone Encounter (Signed)
Results given. Pt expressed understanding. No further questions at this time. Pt advised to call the office if need anything.

## 2022-09-04 NOTE — Assessment & Plan Note (Signed)
He was getting meals on wheels, however they stopped and he was told it was only for 4 weeks. He has another appointment with [facility] on 09/08/22, encouraged him to keep his phone nearby when they call around 1:15pm. Also gave him their phone number if he wants to reach out sooner.

## 2022-09-04 NOTE — Telephone Encounter (Signed)
-----   Message from Charyl Dancer, NP sent at 09/04/2022 12:39 PM EST ----- Please let Gary Gay know that his blood counts have stabilized. Start taking the iron supplement daily like we discussed.

## 2022-09-04 NOTE — Patient Instructions (Signed)
It was great to see you!  [name], Arita Miss, MSW, LCSW-A  Social Worker [facility] Care Management  [phone]  She is going to call on Monday around 1:15pm.    You can stop the amitriptyline.   [place] Disability: General Information: [phone]  You can call to reschedule your GI appointment: Address: [address], [place], [place] [address] Phone: [phone]  Start 1 iron tablet daily with food. I sent this to the pharmacy or your can get it over the counter.   Let's follow-up in 3 months, sooner if you have concerns.  If a referral was placed today, you will be contacted for an appointment. Please note that routine referrals can sometimes take up to 3-4 weeks to process. Please call our office if you haven't heard anything after this time frame.  Take care,  Vance Peper, NP

## 2022-09-04 NOTE — Assessment & Plan Note (Signed)
He is still having some numbness and tingling on his right side. Discussed that this can occur after a stroke and will usually go away after time. Will stop the amitriptyline as it didn't help his symptoms. Continue following with neurology as scheduled.

## 2022-09-08 ENCOUNTER — Ambulatory Visit: Payer: Self-pay | Admitting: Licensed Clinical Social Worker

## 2022-09-08 NOTE — Patient Outreach (Signed)
  Care Coordination   09/08/2022 Name: Gary Gay MRN: [id number] DOB: 06/13/1956   Care Coordination Outreach Attempts:  A second unsuccessful outreach was attempted today to offer the patient with information about available care coordination services as a benefit of their health plan.     Follow Up Plan:  Additional outreach attempts will be made to offer the patient care coordination information and services.   Encounter Outcome:  No Answer  Care Coordination Interventions Activated:  No   Care Coordination Interventions:  No, not indicated    [name], BSW, MSW, [place]  Social Worker [facility] Care Management  [phone]

## 2022-09-11 ENCOUNTER — Ambulatory Visit: Payer: Self-pay

## 2022-09-11 NOTE — Patient Outreach (Signed)
  Care Coordination   09/11/2022 Name: Gary Gay MRN: [id number] DOB: Apr 08, 1956   Care Coordination Outreach Attempts:  An unsuccessful telephone outreach was attempted today to offer the patient information about available care coordination services as a benefit of their health plan.   Follow Up Plan:  Additional outreach attempts will be made to offer the patient care coordination information and services.   Encounter Outcome:  No Answer  Care Coordination Interventions Activated:  No   Care Coordination Interventions:  No, not indicated    [name], RN, MSN [facility] Care Management Care Management Coordinator Direct Line [phone]

## 2022-09-15 ENCOUNTER — Telehealth: Payer: Self-pay | Admitting: Nurse Practitioner

## 2022-09-15 NOTE — Telephone Encounter (Signed)
Left message for patient to call back and schedule Medicare Annual Wellness Visit (AWV) in office.   If not able to come in office, please offer to do virtually or by telephone.  Left office number and my jabber [phone].  Last AWV:06/27/2022   Please schedule at anytime with Nurse Health Advisor.

## 2022-09-25 ENCOUNTER — Ambulatory Visit: Payer: Self-pay

## 2022-09-25 NOTE — Patient Outreach (Signed)
  Care Coordination   09/25/2022 Name: Gary Gay MRN: [id number] DOB: 10/19/1956   Care Coordination Outreach Attempts:  A second unsuccessful outreach was attempted today to offer the patient with information about available care coordination services as a benefit of their health plan.     Follow Up Plan:  Additional outreach attempts will be made to offer the patient care coordination information and services.   Encounter Outcome:  No Answer   Care Coordination Interventions:  No, not indicated    [name], RN, MSN [place] [facility] Care Management Coordinator Direct Line [phone]

## 2022-09-30 ENCOUNTER — Ambulatory Visit (INDEPENDENT_AMBULATORY_CARE_PROVIDER_SITE_OTHER): Payer: Medicare PPO | Admitting: Nurse Practitioner

## 2022-09-30 ENCOUNTER — Encounter: Payer: Self-pay | Admitting: Nurse Practitioner

## 2022-09-30 VITALS — BP 160/80 | HR 87 | Temp 98.3°F | Wt 124.8 lb

## 2022-09-30 DIAGNOSIS — Z8673 Personal history of transient ischemic attack (TIA), and cerebral infarction without residual deficits: Secondary | ICD-10-CM | POA: Diagnosis not present

## 2022-09-30 DIAGNOSIS — Z5941 Food insecurity: Secondary | ICD-10-CM | POA: Diagnosis not present

## 2022-09-30 DIAGNOSIS — R531 Weakness: Secondary | ICD-10-CM | POA: Diagnosis not present

## 2022-09-30 DIAGNOSIS — I10 Essential (primary) hypertension: Secondary | ICD-10-CM | POA: Diagnosis not present

## 2022-09-30 MED ORDER — LOSARTAN POTASSIUM 25 MG PO TABS: 25.0000 mg | ORAL_TABLET | Freq: Every day | ORAL | 1 refills | Status: DC

## 2022-09-30 MED ORDER — HYDROCHLOROTHIAZIDE 12.5 MG PO CAPS: 12.5000 mg | ORAL_CAPSULE | Freq: Every day | ORAL | 0 refills | Status: DC

## 2022-09-30 MED ORDER — LOSARTAN POTASSIUM-HCTZ 50-12.5 MG PO TABS: 1.0000 | ORAL_TABLET | Freq: Every day | ORAL | 1 refills | Status: DC

## 2022-09-30 NOTE — Progress Notes (Signed)
Acute Office Visit  Subjective:     Patient ID: Gary Gay, male    DOB: 03-26-1956, 66 y.o.   MRN: [id number]  Chief Complaint  Patient presents with   Leg Swelling    Right side, started with pain and swelling about 3 months ago. Does elevate leg when resting/sitting.  Fasting.    HPI Patient is in today for follow-up on his numbness and tingling, along with swelling in his right arm, side, leg.  He states that it has not gotten any better over the past few months.  He has tried amitriptyline and gabapentin without relief.  This happened since he had a stroke.  He does have a little bit of weakness on his right side.  He also states that he missed taking his blood pressure medicine for the past 2 days.  He has it available at home, and states that he will take it when he gets home.  He denies chest pain, shortness of breath, headaches.   ROS See pertinent positives and negatives per HPI.      Objective:    BP (!) 160/80 (BP Location: Left Arm, Cuff Size: Normal)   Pulse 87   Temp 98.3 F (36.8 C) (Oral)   Wt 124 lb 12.8 oz (56.6 kg)   SpO2 100%   BMI 19.55 kg/m    Physical Exam Vitals and nursing note reviewed.  Constitutional:      Appearance: Normal appearance.  HENT:     Head: Normocephalic.  Eyes:     Conjunctiva/sclera: Conjunctivae normal.  Cardiovascular:     Rate and Rhythm: Normal rate and regular rhythm.     Pulses: Normal pulses.     Heart sounds: Normal heart sounds.  Pulmonary:     Effort: Pulmonary effort is normal.     Breath sounds: Normal breath sounds.  Musculoskeletal:     Cervical back: Normal range of motion.  Skin:    General: Skin is warm.  Neurological:     General: No focal deficit present.     Mental Status: He is alert and oriented to person, place, and time.  Psychiatric:        Mood and Affect: Mood normal.        Behavior: Behavior normal.        Thought Content: Thought content normal.        Judgment: Judgment  normal.        Assessment & Plan:   Problem List Items Addressed This Visit       Cardiovascular and Mediastinum   Essential hypertension - Primary (Chronic)    His blood pressure is elevated today at 160/80.  He states that he missed his past 2 days of blood pressure medication.  He has some at home, however does need a refill.  Will refill losartan 25 mg daily.  Will also start him on HCTZ 12.5 mg daily.  Follow-up in 4 weeks.      Relevant Medications   losartan (COZAAR) 25 MG tablet   hydrochlorothiazide (MICROZIDE) 12.5 MG capsule     Other   History of CVA (cerebrovascular accident)    He has a history of CVA 02/2022.  He is still having some numbness and tingling and a swelling sensation in his right arm, leg, side.  On exam it appears slightly swollen, however not significantly.  Will have him start HCTZ 12.5 mg daily to see if this helps with the swelling sensation.  He can also wear a  compression sleeve to see if this helps.  He has tried amitriptyline and gabapentin without relief.  Follow-up in 4 weeks.      Relevant Orders   Ambulatory referral to Physical Therapy   Food insecurity    He has missed 5 phone calls in November from [facility] trying to help with food insecurity.  Encouraged him to answer his phone calls and gave him the number that he can call for [facility].      Other Visit Diagnoses     Weakness       Ongoing weakness on his right side since his CVA in 02/2022.  Will place referral to physical therapy.   Relevant Orders   Ambulatory referral to Physical Therapy       Meds ordered this encounter  Medications   DISCONTD: losartan-hydrochlorothiazide (HYZAAR) 50-12.5 MG tablet    Sig: Take 1 tablet by mouth daily.    Dispense:  90 tablet    Refill:  1   losartan (COZAAR) 25 MG tablet    Sig: Take 1 tablet (25 mg total) by mouth daily.    Dispense:  90 tablet    Refill:  1   hydrochlorothiazide (MICROZIDE) 12.5 MG capsule    Sig: Take 1 capsule (12.5 mg  total) by mouth daily.    Dispense:  90 capsule    Refill:  0    Return in about 4 weeks (around 10/28/2022) for swelling, pain.  Charyl Dancer, NP

## 2022-09-30 NOTE — Assessment & Plan Note (Signed)
He has a history of CVA 02/2022.  He is still having some numbness and tingling and a swelling sensation in his right arm, leg, side.  On exam it appears slightly swollen, however not significantly.  Will have him start HCTZ 12.5 mg daily to see if this helps with the swelling sensation.  He can also wear a compression sleeve to see if this helps.  He has tried amitriptyline and gabapentin without relief.  Follow-up in 4 weeks.

## 2022-09-30 NOTE — Assessment & Plan Note (Signed)
His blood pressure is elevated today at 160/80.  He states that he missed his past 2 days of blood pressure medication.  He has some at home, however does need a refill.  Will refill losartan 25 mg daily.  Will also start him on HCTZ 12.5 mg daily.  Follow-up in 4 weeks.

## 2022-09-30 NOTE — Assessment & Plan Note (Signed)
He has missed 5 phone calls in November from [facility] trying to help with food insecurity.  Encouraged him to answer his phone calls and gave him the number that he can call for [facility].

## 2022-09-30 NOTE — Patient Instructions (Addendum)
It was great to see you!  Start hydrochlorothiazide 1 tablet daily to help with the swelling. You can also wear a compression sleeve.   I have placed a referral to physical therapy.   They have been trying to call you multiple times from [place]:  [name], RN, MSN [place] [facility] Care Management Coordinator Direct Line [phone]  Let's follow-up in 4 weeks, sooner if you have concerns.  If a referral was placed today, you will be contacted for an appointment. Please note that routine referrals can sometimes take up to 3-4 weeks to process. Please call our office if you haven't heard anything after this time frame.  Take care,  Vance Peper, NP

## 2022-10-10 ENCOUNTER — Ambulatory Visit: Payer: Medicare PPO | Admitting: Nurse Practitioner

## 2022-10-16 ENCOUNTER — Ambulatory Visit: Payer: Self-pay

## 2022-10-16 NOTE — Patient Outreach (Signed)
  Care Coordination   10/16/2022 Name: Gary Gay MRN: [id number] DOB: 1956-10-08   Care Coordination Outreach Attempts:  A third unsuccessful outreach was attempted today to offer the patient with information about available care coordination services as a benefit of their health plan.   Follow Up Plan:  No further outreach attempts will be made at this time. We have been unable to contact the patient to offer or enroll patient in care coordination services  Encounter Outcome:  No Answer   Care Coordination Interventions:  No, not indicated    [name], RN, MSN [place] [facility] Care Management Coordinator Direct Line [phone]

## 2022-10-27 NOTE — Progress Notes (Unsigned)
   Established Patient Office Visit  Subjective   Patient ID: Gary Gay, male    DOB: 1956/06/14  Age: 67 y.o. MRN: [id number]  No chief complaint on file.   HPI  Gary Gay is here to follow up on hypertension and swelling on his right side. Last visit he was started on hctz 12.5 mg daily.   {History (Optional):23778}  ROS    Objective:     There were no vitals taken for this visit. {Vitals History (Optional):23777}  Physical Exam   No results found for any visits on 10/28/22.  {Labs (Optional):23779}  The ASCVD Risk score (Arnett DK, et al., 2019) failed to calculate for the following reasons:   The patient has a prior MI or stroke diagnosis    Assessment & Plan:   Problem List Items Addressed This Visit   None   No follow-ups on file.    Charyl Dancer, NP

## 2022-10-28 ENCOUNTER — Other Ambulatory Visit: Payer: Self-pay

## 2022-10-28 ENCOUNTER — Encounter: Payer: Self-pay | Admitting: Nurse Practitioner

## 2022-10-28 ENCOUNTER — Ambulatory Visit (INDEPENDENT_AMBULATORY_CARE_PROVIDER_SITE_OTHER): Payer: Medicare PPO | Admitting: Nurse Practitioner

## 2022-10-28 VITALS — BP 128/82 | HR 79 | Temp 96.9°F | Ht 67.0 in | Wt 131.0 lb

## 2022-10-28 DIAGNOSIS — R200 Anesthesia of skin: Secondary | ICD-10-CM | POA: Diagnosis not present

## 2022-10-28 DIAGNOSIS — Z8673 Personal history of transient ischemic attack (TIA), and cerebral infarction without residual deficits: Secondary | ICD-10-CM

## 2022-10-28 DIAGNOSIS — I10 Essential (primary) hypertension: Secondary | ICD-10-CM | POA: Diagnosis not present

## 2022-10-28 DIAGNOSIS — E785 Hyperlipidemia, unspecified: Secondary | ICD-10-CM

## 2022-10-28 DIAGNOSIS — E119 Type 2 diabetes mellitus without complications: Secondary | ICD-10-CM | POA: Diagnosis not present

## 2022-10-28 DIAGNOSIS — D649 Anemia, unspecified: Secondary | ICD-10-CM | POA: Diagnosis not present

## 2022-10-28 DIAGNOSIS — F4310 Post-traumatic stress disorder, unspecified: Secondary | ICD-10-CM | POA: Diagnosis not present

## 2022-10-28 DIAGNOSIS — R202 Paresthesia of skin: Secondary | ICD-10-CM | POA: Diagnosis not present

## 2022-10-28 DIAGNOSIS — Z5900 Homelessness unspecified: Secondary | ICD-10-CM

## 2022-10-28 LAB — BASIC METABOLIC PANEL
BUN: 5 mg/dL — ABNORMAL LOW (ref 6–23)
CO2: 26 mEq/L (ref 19–32)
Calcium: 9.9 mg/dL (ref 8.4–10.5)
Chloride: 100 mEq/L (ref 96–112)
Creatinine, Ser: 1.18 mg/dL (ref 0.40–1.50)
GFR: 64.4 mL/min (ref >60.00)
Glucose, Bld: 177 mg/dL — ABNORMAL HIGH (ref 70–99)
Potassium: 3.7 mEq/L (ref 3.5–5.1)
Sodium: 138 mEq/L (ref 135–145)

## 2022-10-28 LAB — CBC
HCT: 33.2 % — ABNORMAL LOW (ref 39.0–52.0)
Hemoglobin: 10.6 g/dL — ABNORMAL LOW (ref 13.0–17.0)
MCHC: 31.9 g/dL (ref 30.0–36.0)
MCV: 78.5 fl (ref 78.0–100.0)
Platelets: 405 10*3/uL — ABNORMAL HIGH (ref 150.0–400.0)
RBC: 4.22 Mil/uL (ref 4.22–5.81)
RDW: 18.4 % — ABNORMAL HIGH (ref 11.5–15.5)
WBC: 4.4 10*3/uL (ref 4.0–10.5)

## 2022-10-28 LAB — LIPID PANEL
Cholesterol: 165 mg/dL (ref 0–200)
HDL: 38.6 mg/dL — ABNORMAL LOW (ref >39.00)
LDL Cholesterol: 94 mg/dL (ref 0–99)
NonHDL: 125.99
Total CHOL/HDL Ratio: 4
Triglycerides: 162 mg/dL — ABNORMAL HIGH (ref 0.0–149.0)
VLDL: 32.4 mg/dL (ref 0.0–40.0)

## 2022-10-28 LAB — HEMOGLOBIN A1C: Hgb A1c MFr Bld: 8.4 % — ABNORMAL HIGH (ref 4.6–6.5)

## 2022-10-28 LAB — VITAMIN B12: Vitamin B-12: 181 pg/mL — ABNORMAL LOW (ref 211–911)

## 2022-10-28 MED ORDER — TIZANIDINE HCL 4 MG PO TABS: 4.0000 mg | ORAL_TABLET | Freq: Four times a day (QID) | ORAL | 0 refills | Status: DC | PRN

## 2022-10-28 NOTE — Telephone Encounter (Signed)
Called lvm for pt to let know that his rx has been sent to [facility] .

## 2022-10-28 NOTE — Assessment & Plan Note (Signed)
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  Will place referral to therapy.  He can also reach out to the [place] to see if there are any options for therapy for him there.

## 2022-10-28 NOTE — Assessment & Plan Note (Signed)
Will check A1c levels today to see how control is doing.  Continue metformin 500 mg daily.  Encouraged him to get an eye appointment.  Follow-up in 3 months.

## 2022-10-28 NOTE — Assessment & Plan Note (Signed)
He has not been taking the iron supplement.  Will check CBC and iron panel today.

## 2022-10-28 NOTE — Assessment & Plan Note (Signed)
He has a history of CVA with ongoing right arm stiffness and weakness.  He was unable to afford physical therapy.  Will give him some stretches to do at home and also start him on tizanidine 4 mg as needed for tightness and spasms.  Follow-up in 3 months.

## 2022-10-28 NOTE — Patient Instructions (Signed)
It was great to see you!  I have attached some stretches for you to do daily.   You can start tizanidine every 8 hours as needed for tightness or spasms.   Let's follow-up in 3 months, sooner if you have concerns.  If a referral was placed today, you will be contacted for an appointment. Please note that routine referrals can sometimes take up to 3-4 weeks to process. Please call our office if you haven't heard anything after this time frame.  Take care,  Vance Peper, NP

## 2022-10-28 NOTE — Assessment & Plan Note (Signed)
Continue atorvastatin 80 mg daily and we will check lipid panel today.

## 2022-10-28 NOTE — Assessment & Plan Note (Signed)
Chronic, stable.  BP today 128/82.  Continue losartan-HCTZ 50-12.5 mg daily.  Check BMP, CBC today.  Follow-up in 3 months.

## 2022-10-29 ENCOUNTER — Telehealth: Payer: Self-pay

## 2022-10-29 DIAGNOSIS — E119 Type 2 diabetes mellitus without complications: Secondary | ICD-10-CM | POA: Diagnosis not present

## 2022-10-29 LAB — IRON,TIBC AND FERRITIN PANEL
%SAT: 6 % (calc) — ABNORMAL LOW (ref 20–48)
Ferritin: 9 ng/mL — ABNORMAL LOW (ref 24–380)
Iron: 23 ug/dL — ABNORMAL LOW (ref 50–180)
TIBC: 412 mcg/dL (calc) (ref 250–425)

## 2022-10-29 MED ORDER — VITAMIN B-12 1000 MCG PO TABS: 1000.0000 ug | ORAL_TABLET | Freq: Every day | ORAL | 1 refills | Status: DC

## 2022-10-29 MED ORDER — METFORMIN HCL ER 500 MG PO TB24: 1000.0000 mg | ORAL_TABLET | Freq: Every day | ORAL | 1 refills | Status: DC

## 2022-10-29 MED ORDER — IRON (FERROUS SULFATE) 325 (65 FE) MG PO TABS: 325.0000 mg | ORAL_TABLET | Freq: Every day | ORAL | 2 refills | Status: AC

## 2022-10-29 NOTE — Telephone Encounter (Signed)
I left a message for the patient to return my call.

## 2022-10-29 NOTE — Addendum Note (Signed)
Addended by: Vance Peper A on: 10/29/2022 08:08 AM   Modules accepted: Orders

## 2022-10-29 NOTE — Addendum Note (Signed)
Addended by: Vance Peper A on: 10/29/2022 02:56 PM   Modules accepted: Orders

## 2022-10-29 NOTE — Telephone Encounter (Signed)
-----   Message from Charyl Dancer, NP sent at 10/29/2022  8:08 AM EST ----- Please let Gary Gay know that his sugars are slightly elevated, just a little above goal. I would like him to start taking 2 metformins daily instead of 1. His iron is still low, however his blood counts are stable. Would he like a referral for an iron infusion? His B12 is also low, I am sending in a vitamin B12 pill to take once a day.

## 2022-10-30 DIAGNOSIS — Z5900 Homelessness unspecified: Secondary | ICD-10-CM | POA: Insufficient documentation

## 2022-10-30 NOTE — Assessment & Plan Note (Signed)
He is currently living in his car. He states that a friend did offer him a place to stay that has a bed and heater, but he doesn't want to be a burden. I discussed that since he is offering, and not using this bed, it most likely is not a burden to him. Also discussed going to a homeless shelter. He states that he is "too proud and stubborn." He received numerous phone calls from [facility] for assistance, however he never called them back. Gave him the phone number again to call and reach out to [facility].

## 2022-11-01 ENCOUNTER — Other Ambulatory Visit: Payer: Self-pay | Admitting: Nurse Practitioner

## 2022-11-05 ENCOUNTER — Other Ambulatory Visit: Payer: Self-pay | Admitting: Nurse Practitioner

## 2022-11-05 ENCOUNTER — Telehealth: Payer: Self-pay | Admitting: Nurse Practitioner

## 2022-11-05 NOTE — Telephone Encounter (Signed)
PAPERWORK/FORMS received  Dropped off by: [name] Call back #: [phone] Individual made aware of 3-5 business day turn around (YES/NO): yes GREEN charge sheet completed and patient made aware of possible charge (YES/NO): yes Placed in provider folder at front desk. ~~~ route to CMA/provider Team  CLINICAL USE BELOW THIS LINE (use X to signify action taken)  ___ Form received and placed in providers office for signature. ___ Form completed and faxed to LOA Dept.  ___ Form completed & LVM to notify patient ready for pick up.  ___ Charge sheet and copy of form in front office folder for office supervisor.

## 2022-11-06 ENCOUNTER — Telehealth: Payer: Self-pay

## 2022-11-06 NOTE — Telephone Encounter (Signed)
Called and lvm for patient call us back regarding paperwork .

## 2022-11-07 ENCOUNTER — Ambulatory Visit: Payer: Medicare PPO

## 2022-12-01 ENCOUNTER — Ambulatory Visit (INDEPENDENT_AMBULATORY_CARE_PROVIDER_SITE_OTHER): Payer: Medicare PPO

## 2022-12-01 VITALS — Ht 67.0 in | Wt 129.0 lb

## 2022-12-01 DIAGNOSIS — Z0000 Encounter for general adult medical examination without abnormal findings: Secondary | ICD-10-CM

## 2022-12-01 NOTE — Patient Instructions (Signed)
Gary Gay , Thank you for taking time to come for your Medicare Wellness Visit. I appreciate your ongoing commitment to your health goals. Please review the following plan we discussed and let me know if I can assist you in the future.   These are the goals we discussed:  Goals      Patient Stated     12/01/2022, wants to gain weight        This is a list of the screening recommended for you and due dates:  Health Maintenance  Topic Date Due   Eye exam for diabetics  Never done   Hepatitis C Screening: USPSTF Recommendation to screen - Ages 67-67 yo.  Never done   Zoster (Shingles) Vaccine (1 of 2) Never done   Pneumonia Vaccine (2 - PCV) 04/11/2023*   Yearly kidney health urinalysis for diabetes  01/17/2023   Complete foot exam   01/17/2023   Cologuard (Stool DNA test)  01/17/2023   Hemoglobin A1C  04/28/2023   Yearly kidney function blood test for diabetes  10/29/2023   Medicare Annual Wellness Visit  12/02/2023   DTaP/Tdap/Td vaccine (2 - Td or Tdap) 04/09/2025   Flu Shot  Completed   HPV Vaccine  Aged Out   COVID-19 Vaccine  Discontinued  *Topic was postponed. The date shown is not the original due date.    Advanced directives: Advance directive discussed with you today.   Conditions/risks identified: smoking  Next appointment: Follow up in one year for your annual wellness visit.   Preventive Care 67 Years and Older, Male  Preventive care refers to lifestyle choices and visits with your health care provider that can promote health and wellness. What does preventive care include? A yearly physical exam. This is also called an annual well check. Dental exams once or twice a year. Routine eye exams. Ask your health care provider how often you should have your eyes checked. Personal lifestyle choices, including: Daily care of your teeth and gums. Regular physical activity. Eating a healthy diet. Avoiding tobacco and drug use. Limiting alcohol use. Practicing safe  sex. Taking low doses of aspirin every day. Taking vitamin and mineral supplements as recommended by your health care provider. What happens during an annual well check? The services and screenings done by your health care provider during your annual well check will depend on your age, overall health, lifestyle risk factors, and family history of disease. Counseling  Your health care provider may ask you questions about your: Alcohol use. Tobacco use. Drug use. Emotional well-being. Home and relationship well-being. Sexual activity. Eating habits. History of falls. Memory and ability to understand (cognition). Work and work Statistician. Screening  You may have the following tests or measurements: Height, weight, and BMI. Blood pressure. Lipid and cholesterol levels. These may be checked every 5 years, or more frequently if you are over 65 years old. Skin check. Lung cancer screening. You may have this screening every year starting at age 67 if you have a 30-pack-year history of smoking and currently smoke or have quit within the past 15 years. Fecal occult blood test (FOBT) of the stool. You may have this test every year starting at age 67. Flexible sigmoidoscopy or colonoscopy. You may have a sigmoidoscopy every 5 years or a colonoscopy every 10 years starting at age 43. Prostate cancer screening. Recommendations will vary depending on your family history and other risks. Hepatitis C blood test. Hepatitis B blood test. Sexually transmitted disease (STD) testing. Diabetes screening. This  is done by checking your blood sugar (glucose) after you have not eaten for a while (fasting). You may have this done every 1-3 years. Abdominal aortic aneurysm (AAA) screening. You may need this if you are a current or former smoker. Osteoporosis. You may be screened starting at age 67 if you are at high risk. Talk with your health care provider about your test results, treatment options, and if  necessary, the need for more tests. Vaccines  Your health care provider may recommend certain vaccines, such as: Influenza vaccine. This is recommended every year. Tetanus, diphtheria, and acellular pertussis (Tdap, Td) vaccine. You may need a Td booster every 10 years. Zoster vaccine. You may need this after age 67. Pneumococcal 13-valent conjugate (PCV13) vaccine. One dose is recommended after age 67. Pneumococcal polysaccharide (PPSV23) vaccine. One dose is recommended after age 67. Talk to your health care provider about which screenings and vaccines you need and how often you need them. This information is not intended to replace advice given to you by your health care provider. Make sure you discuss any questions you have with your health care provider. Document Released: 11/09/2015 Document Revised: 07/02/2016 Document Reviewed: 08/14/2015 Elsevier Interactive Patient Education  2017 [employer] Prevention in the Home Falls can cause injuries. They can happen to people of all ages. There are many things you can do to make your home safe and to help prevent falls. What can I do on the outside of my home? Regularly fix the edges of walkways and driveways and fix any cracks. Remove anything that might make you trip as you walk through a door, such as a raised step or threshold. Trim any bushes or trees on the path to your home. Use bright outdoor lighting. Clear any walking paths of anything that might make someone trip, such as rocks or tools. Regularly check to see if handrails are loose or broken. Make sure that both sides of any steps have handrails. Any raised decks and porches should have guardrails on the edges. Have any leaves, snow, or ice cleared regularly. Use sand or salt on walking paths during winter. Clean up any spills in your garage right away. This includes oil or grease spills. What can I do in the bathroom? Use night lights. Install grab bars by the toilet  and in the tub and shower. Do not use towel bars as grab bars. Use non-skid mats or decals in the tub or shower. If you need to sit down in the shower, use a plastic, non-slip stool. Keep the floor dry. Clean up any water that spills on the floor as soon as it happens. Remove soap buildup in the tub or shower regularly. Attach bath mats securely with double-sided non-slip rug tape. Do not have throw rugs and other things on the floor that can make you trip. What can I do in the bedroom? Use night lights. Make sure that you have a light by your bed that is easy to reach. Do not use any sheets or blankets that are too big for your bed. They should not hang down onto the floor. Have a firm chair that has side arms. You can use this for support while you get dressed. Do not have throw rugs and other things on the floor that can make you trip. What can I do in the kitchen? Clean up any spills right away. Avoid walking on wet floors. Keep items that you use a lot in easy-to-reach places. If you  need to reach something above you, use a strong step stool that has a grab bar. Keep electrical cords out of the way. Do not use floor polish or wax that makes floors slippery. If you must use wax, use non-skid floor wax. Do not have throw rugs and other things on the floor that can make you trip. What can I do with my stairs? Do not leave any items on the stairs. Make sure that there are handrails on both sides of the stairs and use them. Fix handrails that are broken or loose. Make sure that handrails are as long as the stairways. Check any carpeting to make sure that it is firmly attached to the stairs. Fix any carpet that is loose or worn. Avoid having throw rugs at the top or bottom of the stairs. If you do have throw rugs, attach them to the floor with carpet tape. Make sure that you have a light switch at the top of the stairs and the bottom of the stairs. If you do not have them, ask someone to add  them for you. What else can I do to help prevent falls? Wear shoes that: Do not have high heels. Have rubber bottoms. Are comfortable and fit you well. Are closed at the toe. Do not wear sandals. If you use a stepladder: Make sure that it is fully opened. Do not climb a closed stepladder. Make sure that both sides of the stepladder are locked into place. Ask someone to hold it for you, if possible. Clearly mark and make sure that you can see: Any grab bars or handrails. First and last steps. Where the edge of each step is. Use tools that help you move around (mobility aids) if they are needed. These include: Canes. Walkers. Scooters. Crutches. Turn on the lights when you go into a dark area. Replace any light bulbs as soon as they burn out. Set up your furniture so you have a clear path. Avoid moving your furniture around. If any of your floors are uneven, fix them. If there are any pets around you, be aware of where they are. Review your medicines with your doctor. Some medicines can make you feel dizzy. This can increase your chance of falling. Ask your doctor what other things that you can do to help prevent falls. This information is not intended to replace advice given to you by your health care provider. Make sure you discuss any questions you have with your health care provider. Document Released: 08/09/2009 Document Revised: 03/20/2016 Document Reviewed: 11/17/2014 Elsevier Interactive Patient Education  2017 [employer].

## 2022-12-01 NOTE — Progress Notes (Signed)
I connected with Gary Gay today by telephone and verified that I am speaking with the correct person using two identifiers. Location patient: home Location provider: work Persons participating in the virtual visit: Gary Gay, [name] LPN.   I discussed the limitations, risks, security and privacy concerns of performing an evaluation and management service by telephone and the availability of in person appointments. I also discussed with the patient that there may be a patient responsible charge related to this service. The patient expressed understanding and verbally consented to this telephonic visit.    Interactive audio and video telecommunications were attempted between this provider and patient, however failed, due to patient having technical difficulties OR patient did not have access to video capability.  We continued and completed visit with audio only.     Vital signs may be patient reported or missing.  Subjective:   Gary Gay is a 67 y.o. male who presents for an Initial Medicare Annual Wellness Visit.  Review of Systems     Cardiac Risk Factors include: advanced age (>39mn, >>21women);diabetes mellitus;dyslipidemia;hypertension;male gender;smoking/ tobacco exposure     Objective:    Today's Vitals   12/01/22 0827  Weight: 129 lb (58.5 kg)  Height: '5\' 7"'$  (1.702 m)   Body mass index is 20.2 kg/m.     12/01/2022    8:32 AM 08/13/2022    9:09 AM 03/05/2022   12:33 PM 02/25/2022   11:24 AM 02/24/2022    5:12 PM 09/29/2016    9:54 AM  Advanced Directives  Does Patient Have a Medical Advance Directive? Yes No No  No No  Type of [name]       Copy of [name] Chart? No - copy requested       Would patient like information on creating a medical advance directive?    No - Patient declined  No - Patient declined    Current Medications (verified) Outpatient Encounter Medications as of  12/01/2022  Medication Sig   acetaminophen (TYLENOL) 325 MG tablet Take 650 mg by mouth every 6 (six) hours as needed for mild pain.   aspirin EC 81 MG tablet TAKE ONE TABLET BY MOUTH AS DIRECTED BY YOUR MEDICAL PROVIDER   atorvastatin (LIPITOR) 80 MG tablet Take 1 tablet (80 mg total) by mouth daily.   cyanocobalamin (VITAMIN B12) 1000 MCG tablet Take 1 tablet (1,000 mcg total) by mouth daily.   Iron, Ferrous Sulfate, 325 (65 Fe) MG TABS Take 325 mg by mouth daily.   losartan-hydrochlorothiazide (HYZAAR) 50-12.5 MG tablet Take 1 tablet by mouth daily.   metFORMIN (GLUCOPHAGE-XR) 500 MG 24 hr tablet Take 2 tablets (1,000 mg total) by mouth daily with breakfast.   tiZANidine (ZANAFLEX) 4 MG tablet Take 1 tablet (4 mg total) by mouth every 6 (six) hours as needed for muscle spasms.   No facility-administered encounter medications on file as of 12/01/2022.    Allergies (verified) Patient has no known allergies.   History: Past Medical History:  Diagnosis Date   Diabetes mellitus    Past Surgical History:  Procedure Laterality Date   SHOULDER SURGERY Right    Family History  Problem Relation Age of Onset   Cancer Sister        pancreatic   Diabetes Maternal Grandmother    Stroke Neg Hx    Social History   Socioeconomic History   Marital status: Divorced    Spouse name: Not on file  Number of children: Not on file   Years of education: Not on file   Highest education level: Not on file  Occupational History   Occupation: bus driver  Tobacco Use   Smoking status: Every Day    Packs/day: 0.25    Years: 49.00    Total pack years: 12.25    Types: Cigarettes   Smokeless tobacco: Never  Vaping Use   Vaping Use: Never used  Substance and Sexual Activity   Alcohol use: Not Currently    Comment: social use remotely   Drug use: No   Sexual activity: Not on file  Other Topics Concern   Not on file  Social History Narrative   Not on file   Social Determinants of Health    Financial Resource Strain: Low Risk  (12/01/2022)   Overall Financial Resource Strain (CARDIA)    Difficulty of Paying Living Expenses: Not hard at all  Food Insecurity: No Food Insecurity (12/01/2022)   Hunger Vital Sign    Worried About Running Out of Food in the Last Year: Never true    [place] in the Last Year: Never true  Transportation Needs: No Transportation Needs (12/01/2022)   PRAPARE - Hydrologist (Medical): No    Lack of Transportation (Non-Medical): No  Physical Activity: Inactive (12/01/2022)   Exercise Vital Sign    Days of Exercise per Week: 0 days    Minutes of Exercise per Session: 0 min  Stress: No Stress Concern Present (12/01/2022)   [place]    Feeling of Stress : Not at all  Social Connections: Not on file    Tobacco Counseling Ready to quit: Yes Counseling given: Not Answered   Clinical Intake:  Pre-visit preparation completed: Yes  Pain : No/denies pain     Nutritional Status: BMI of 19-24  Normal Nutritional Risks: None Diabetes: Yes  How often do you need to have someone help you when you read instructions, pamphlets, or other written materials from your doctor or pharmacy?: 1 - Never  Diabetic? Yes Nutrition Risk Assessment:  Has the patient had any N/V/D within the last 2 months?  No  Does the patient have any non-healing wounds?  No  Has the patient had any unintentional weight loss or weight gain?  No   Diabetes:  Is the patient diabetic?  Yes  If diabetic, was a CBG obtained today?  No  Did the patient bring in their glucometer from home?  No  How often do you monitor your CBG's? Twice daily.   Financial Strains and Diabetes Management:  Are you having any financial strains with the device, your supplies or your medication? No .  Does the patient want to be seen by Chronic Care Management for management of their diabetes?  No   Would the patient like to be referred to a Nutritionist or for Diabetic Management?  No   Diabetic Exams:  Diabetic Eye Exam: Overdue for diabetic eye exam. Pt has been advised about the importance in completing this exam. Patient advised to call and schedule an eye exam. Diabetic Foot Exam: Completed 01/16/2022   Interpreter Needed?: No  Information entered by :: NAllen LPN   Activities of Daily Living    12/01/2022    8:33 AM 02/25/2022   11:21 AM  In your present state of health, do you have any difficulty performing the following activities:  Hearing? 0   Vision?  0   Difficulty concentrating or making decisions? 0   Walking or climbing stairs? 0   Dressing or bathing? 0   Doing errands, shopping? 0 0  Preparing Food and eating ? N   Using the Toilet? N   In the past six months, have you accidently leaked urine? N   Do you have problems with loss of bowel control? N   Managing your Medications? N   Managing your Finances? N   Housekeeping or managing your Housekeeping? N     Patient Care Team: Charyl Dancer, NP as PCP - General (Internal Medicine)  Indicate any recent Medical Services you may have received from other than [facility] providers in the past year (date may be approximate).     Assessment:   This is a routine wellness examination for Gary Gay.  Hearing/Vision screen Vision Screening - Comments:: No regular eye exams, [facility]  Dietary issues and exercise activities discussed: Current Exercise Habits: The patient does not participate in regular exercise at present   Goals Addressed             This Visit's Progress    Patient Stated       12/01/2022, wants to gain weight       Depression Screen    12/01/2022    8:33 AM 09/04/2022    9:05 AM 01/16/2022    2:37 PM  PHQ 2/9 Scores  PHQ - 2 Score 0 0 0    Fall Risk    12/01/2022    8:32 AM 10/28/2022    9:20 AM 09/04/2022    9:05 AM 08/13/2022    9:08 AM 03/05/2022   12:33 PM  [place] in the  past year? 0 0 0 0 0  Number falls in past yr: 0  0 0 0  Injury with Fall? 0  0 0 0  Risk for fall due to : Medication side effect No Fall Risks     Follow up Falls prevention discussed;Education provided;Falls evaluation completed Falls evaluation completed  Falls evaluation completed     FALL RISK PREVENTION PERTAINING TO THE HOME:  Any stairs in or around the home? No  If so, are there any without handrails? N/a Home free of loose throw rugs in walkways, pet beds, electrical cords, etc? Yes  Adequate lighting in your home to reduce risk of falls? Yes   ASSISTIVE DEVICES UTILIZED TO PREVENT FALLS:  Life alert? No  Use of a cane, walker or w/c? No  Grab bars in the bathroom? No  Shower chair or bench in shower? No  Elevated toilet seat or a handicapped toilet? Yes   TIMED UP AND GO:  Was the test performed? No .      Cognitive Function:        12/01/2022    8:33 AM  6CIT Screen  What Year? 0 points  What month? 0 points  What time? 0 points  Count back from 20 2 points  Months in reverse 2 points  Repeat phrase 0 points  Total Score 4 points    Immunizations Immunization History  Administered Date(s) Administered   Fluad Quad(high Dose 65+) 07/11/2022   Influenza,inj,Quad PF,6+ Mos 08/11/2016   Influenza,inj,quad, With Preservative 12/16/2018   Influenza-Unspecified 07/24/2015   Moderna Sars-Covid-2 Vaccination 01/27/2020, 02/24/2020   Pneumococcal Polysaccharide-23 04/10/2015   Tdap 04/10/2015    TDAP status: Up to date  Flu Vaccine status: Up to date  Pneumococcal vaccine status: Up to date  Covid-19 vaccine status: Completed vaccines  Qualifies for Shingles Vaccine? Yes   Zostavax completed No   Shingrix Completed?: No.    Education has been provided regarding the importance of this vaccine. Patient has been advised to call insurance company to determine out of pocket expense if they have not yet received this vaccine. Advised may also receive  vaccine at local pharmacy or Health Dept. Verbalized acceptance and understanding.  Screening Tests Health Maintenance  Topic Date Due   OPHTHALMOLOGY EXAM  Never done   Hepatitis C Screening  Never done   Zoster Vaccines- Shingrix (1 of 2) Never done   Pneumonia Vaccine 15+ Years old (2 - PCV) 04/11/2023 (Originally 04/09/2016)   Diabetic kidney evaluation - Urine ACR  01/17/2023   FOOT EXAM  01/17/2023   Fecal DNA (Cologuard)  01/17/2023   HEMOGLOBIN A1C  04/28/2023   Diabetic kidney evaluation - eGFR measurement  10/29/2023   Medicare Annual Wellness (AWV)  12/02/2023   DTaP/Tdap/Td (2 - Td or Tdap) 04/09/2025   INFLUENZA VACCINE  Completed   HPV VACCINES  Aged Out   COVID-19 Vaccine  Discontinued    Health Maintenance  Health Maintenance Due  Topic Date Due   OPHTHALMOLOGY EXAM  Never done   Hepatitis C Screening  Never done   Zoster Vaccines- Shingrix (1 of 2) Never done    Colorectal cancer screening: Type of screening: FOBT/FIT. Completed 07/17/2022. Repeat every 1 years  Lung Cancer Screening: (Low Dose CT Chest recommended if Age 31-80 years, 30 pack-year currently smoking OR have quit w/in 15years.) does not qualify.   Lung Cancer Screening Referral: no  Additional Screening:  Hepatitis C Screening: does qualify;   Vision Screening: Recommended annual ophthalmology exams for early detection of glaucoma and other disorders of the eye. Is the patient up to date with their annual eye exam?  No  Who is the provider or what is the name of the office in which the patient attends annual eye exams? [facility] If pt is not established with a provider, would they like to be referred to a provider to establish care? No .   Dental Screening: Recommended annual dental exams for proper oral hygiene  Community Resource Referral / Chronic Care Management: CRR required this visit?  No   CCM required this visit?  No      Plan:     I have personally reviewed and noted the  following in the patient's chart:   Medical and social history Use of alcohol, tobacco or illicit drugs  Current medications and supplements including opioid prescriptions. Patient is not currently taking opioid prescriptions. Functional ability and status Nutritional status Physical activity Advanced directives List of other physicians Hospitalizations, surgeries, and ER visits in previous 12 months Vitals Screenings to include cognitive, depression, and falls Referrals and appointments  In addition, I have reviewed and discussed with patient certain preventive protocols, quality metrics, and best practice recommendations. A written personalized care plan for preventive services as well as general preventive health recommendations were provided to patient.     Kellie Simmering, LPN   05/31/2777   Nurse Notes: none  Due to this being a virtual visit, the after visit summary with patients personalized plan was offered to patient via mail or my-chart.  to pick up at office at next visit

## 2022-12-05 ENCOUNTER — Ambulatory Visit: Payer: Medicare PPO | Admitting: Nurse Practitioner

## 2022-12-30 ENCOUNTER — Telehealth: Payer: Self-pay | Admitting: Nurse Practitioner

## 2022-12-30 NOTE — Telephone Encounter (Signed)
Pt called saying he is having problems feeling depressed that his body isn't doing what it should. He is still having tingling in hands and swelling in his side. Pt requesting appt with Lauren tomorrow if possible.   Call back # [phone]

## 2022-12-30 NOTE — Telephone Encounter (Signed)
LVM for patient to return call. 

## 2022-12-31 ENCOUNTER — Telehealth: Payer: Self-pay | Admitting: Nurse Practitioner

## 2022-12-31 NOTE — Telephone Encounter (Signed)
LVM for patient to return call.   Will continue documentation in other open encounter.

## 2022-12-31 NOTE — Telephone Encounter (Signed)
LVM x2 for pt to return call to schedule

## 2022-12-31 NOTE — Telephone Encounter (Signed)
Pt called and said he is returning your call

## 2022-12-31 NOTE — Telephone Encounter (Signed)
Patient called while I was in room with another patient. I  returned call and left a message for patient to call back.

## 2022-12-31 NOTE — Telephone Encounter (Signed)
LVM for patient to return call. 

## 2023-01-01 ENCOUNTER — Encounter: Payer: Self-pay | Admitting: Nurse Practitioner

## 2023-01-01 ENCOUNTER — Ambulatory Visit (INDEPENDENT_AMBULATORY_CARE_PROVIDER_SITE_OTHER): Payer: Medicare HMO | Admitting: Nurse Practitioner

## 2023-01-01 VITALS — BP 138/80 | HR 86 | Temp 98.6°F | Ht 67.0 in | Wt 129.0 lb

## 2023-01-01 DIAGNOSIS — R200 Anesthesia of skin: Secondary | ICD-10-CM

## 2023-01-01 DIAGNOSIS — F320 Major depressive disorder, single episode, mild: Secondary | ICD-10-CM | POA: Diagnosis not present

## 2023-01-01 MED ORDER — DULOXETINE HCL 30 MG PO CPEP: 30.0000 mg | ORAL_CAPSULE | Freq: Every day | ORAL | 1 refills | Status: DC

## 2023-01-01 NOTE — Progress Notes (Signed)
Acute Office Visit  Subjective:     Patient ID: Gary Gay, male    DOB: 11-22-1955, 67 y.o.   MRN: [id number]  Chief Complaint  Patient presents with   Depression    Ongoing for 40 years, lost brother on 12/17/22    HPI Patient is in today for depression and ongoing right arm and leg numbness and weakness.  He still is having some ongoing weakness in his right arm and leg since having his stroke 9 months ago.  He was unable to afford physical therapy.  He states that he is having a hard time straightening his fingers completely without helping with his other hand.  He denies any falls.  He is having some worsening depression with his ongoing health conditions and his brother passed away 2 weeks ago.  He is starting to feel more helpless, however denies SI/HI.  He reached out to the [place] and has an appointment with the social worker for therapy next week.     01/01/2023   11:44 AM 12/01/2022    8:33 AM 09/04/2022    9:05 AM 01/16/2022    2:37 PM  Depression screen PHQ 2/9  Decreased Interest 0 0 0 0  Down, Depressed, Hopeless 1 0 0 0  PHQ - 2 Score 1 0 0 0  Altered sleeping 0     Tired, decreased energy 0     Change in appetite 3     Feeling bad or failure about yourself  0     Trouble concentrating 0     Moving slowly or fidgety/restless 0     Suicidal thoughts 2     PHQ-9 Score 6     Difficult doing work/chores Not difficult at all         01/01/2023   11:45 AM  GAD 7 : Generalized Anxiety Score  Nervous, Anxious, on Edge 3  Control/stop worrying 3  Worry too much - different things 2  Trouble relaxing 0  Restless 0  Easily annoyed or irritable 3  Afraid - awful might happen 0  Total GAD 7 Score 11  Anxiety Difficulty Not difficult at all    ROS See pertinent positives and negatives per HPI.     Objective:    BP 138/80 (BP Location: Right Arm)   Pulse 86   Temp 98.6 F (37 C)   Ht '5\' 7"'$  (1.702 m)   Wt 129 lb (58.5 kg)   SpO2 99%   BMI 20.20 kg/m     Physical Exam Vitals and nursing note reviewed.  Constitutional:      Appearance: Normal appearance.  HENT:     Head: Normocephalic.  Eyes:     Conjunctiva/sclera: Conjunctivae normal.  Cardiovascular:     Rate and Rhythm: Normal rate and regular rhythm.     Pulses: Normal pulses.     Heart sounds: Normal heart sounds.  Pulmonary:     Effort: Pulmonary effort is normal.     Breath sounds: Normal breath sounds.  Musculoskeletal:     Cervical back: Normal range of motion.  Skin:    General: Skin is warm.  Neurological:     General: No focal deficit present.     Mental Status: He is alert and oriented to person, place, and time.  Psychiatric:        Mood and Affect: Mood normal.        Behavior: Behavior normal.        Thought Content: Thought content  normal.        Judgment: Judgment normal.       Assessment & Plan:   Problem List Items Addressed This Visit       Other   Numbness    Chronic, ongoing.  He is having ongoing numbness weakness in his right arm and leg since having his CVA 9 months ago.  He was unable to afford physical therapy, printed out some stretches that he can do daily.  Will also start duloxetine 30 mg daily to see if this helps with the numbness and with his mood.  Follow-up in 4 weeks.      Depression, major, single episode, mild (HCC) - Primary    Chronic, ongoing.  He states that his depression has recently gotten worse with the loss of his brother.  He has an appointment with the social worker scheduled for therapy next week.  Encouraged him to keep this.  He does have feelings of hopelessness, however states he would never kill himself.  His PHQ-9 is a 6 and his GAD-7 is an 62.  Will start duloxetine 30 mg daily.  Follow-up in 4 weeks.      Relevant Medications   amitriptyline (ELAVIL) 10 MG tablet   DULoxetine (CYMBALTA) 30 MG capsule    Meds ordered this encounter  Medications   DULoxetine (CYMBALTA) 30 MG capsule    Sig: Take 1  capsule (30 mg total) by mouth daily.    Dispense:  30 capsule    Refill:  1    Return in about 4 weeks (around 01/29/2023) for depression.  Charyl Dancer, NP

## 2023-01-01 NOTE — Patient Instructions (Signed)
It was great to see you!  I have attached some stretches to do daily.   Start duloxetine 1 capsule daily to help with your mood and numbness sensation.   Keep appointment with social worker next week.   Let's follow-up in 1 month, sooner if you have concerns.  If a referral was placed today, you will be contacted for an appointment. Please note that routine referrals can sometimes take up to 3-4 weeks to process. Please call our office if you haven't heard anything after this time frame.  Take care,  Vance Peper, NP

## 2023-01-01 NOTE — Telephone Encounter (Signed)
I called patient and LVM to call back in regards to message that he left 2 days ago.

## 2023-01-01 NOTE — Telephone Encounter (Signed)
error 

## 2023-01-01 NOTE — Telephone Encounter (Signed)
Patient returned call and I asked him about message that I received 2 days ago. Gary Gay said that he felt like giving up. I asked patient if he could come into the office to be seen today and appointment scheduled for 11am.

## 2023-01-01 NOTE — Assessment & Plan Note (Signed)
Chronic, ongoing.  He states that his depression has recently gotten worse with the loss of his brother.  He has an appointment with the social worker scheduled for therapy next week.  Encouraged him to keep this.  He does have feelings of hopelessness, however states he would never kill himself.  His PHQ-9 is a 6 and his GAD-7 is an 58.  Will start duloxetine 30 mg daily.  Follow-up in 4 weeks.

## 2023-01-01 NOTE — Assessment & Plan Note (Signed)
Chronic, ongoing.  He is having ongoing numbness weakness in his right arm and leg since having his CVA 9 months ago.  He was unable to afford physical therapy, printed out some stretches that he can do daily.  Will also start duloxetine 30 mg daily to see if this helps with the numbness and with his mood.  Follow-up in 4 weeks.

## 2023-01-07 ENCOUNTER — Ambulatory Visit: Payer: Medicare PPO | Admitting: Nurse Practitioner

## 2023-01-27 ENCOUNTER — Ambulatory Visit: Payer: Medicare PPO | Admitting: Nurse Practitioner

## 2023-01-29 ENCOUNTER — Ambulatory Visit: Payer: Medicare HMO | Admitting: Nurse Practitioner

## 2023-02-03 NOTE — Progress Notes (Unsigned)
   Established Patient Office Visit  Subjective   Patient ID: Gary Gay, male    DOB: 1955-11-26  Age: 67 y.o. MRN: [id number]  No chief complaint on file.   HPI  Jawann Hlavinka is here to follow-up on diabetes.   {History (Optional):23778}  ROS    Objective:     There were no vitals taken for this visit. {Vitals History (Optional):23777}  Physical Exam   No results found for any visits on 02/04/23.  {Labs (Optional):23779}  The ASCVD Risk score (Arnett DK, et al., 2019) failed to calculate for the following reasons:   The patient has a prior MI or stroke diagnosis    Assessment & Plan:   Problem List Items Addressed This Visit   None   No follow-ups on file.    Gerre Scull, NP

## 2023-02-04 ENCOUNTER — Ambulatory Visit (INDEPENDENT_AMBULATORY_CARE_PROVIDER_SITE_OTHER): Payer: Medicare HMO | Admitting: Nurse Practitioner

## 2023-02-04 ENCOUNTER — Encounter: Payer: Self-pay | Admitting: Nurse Practitioner

## 2023-02-04 VITALS — BP 136/70 | HR 82 | Temp 97.8°F | Ht 67.0 in | Wt 130.0 lb

## 2023-02-04 DIAGNOSIS — F4310 Post-traumatic stress disorder, unspecified: Secondary | ICD-10-CM

## 2023-02-04 DIAGNOSIS — E119 Type 2 diabetes mellitus without complications: Secondary | ICD-10-CM | POA: Diagnosis not present

## 2023-02-04 DIAGNOSIS — Z5900 Homelessness unspecified: Secondary | ICD-10-CM

## 2023-02-04 LAB — POCT GLYCOSYLATED HEMOGLOBIN (HGB A1C)
HbA1c POC (<> result, manual entry): 7.7 % (ref 4.0–5.6)
HbA1c, POC (controlled diabetic range): 7.7 % — AB (ref 0.0–7.0)
HbA1c, POC (prediabetic range): 7.7 % — AB (ref 5.7–6.4)
Hemoglobin A1C: 7.7 % — AB (ref 4.0–5.6)

## 2023-02-04 NOTE — Assessment & Plan Note (Signed)
Chronic, not controlled. His A1c was 7.7% today. Discussed limiting regular soda or switching to diet. He should also limit his candy and added sugars. Continue metformin XR 1,000mg  daily. Referral placed for ophthalmology. If sugars still elevated next visit, will add on another diabetic medication.

## 2023-02-04 NOTE — Patient Instructions (Addendum)
It was great to see you!  Keep taking your medications every day.  Try to cut back on the amount of pepsi and candy that you are eating.   I have placed a referral to the eye doctor for you.   Let's follow-up in 3 months, sooner if you have concerns.  If a referral was placed today, you will be contacted for an appointment. Please note that routine referrals can sometimes take up to 3-4 weeks to process. Please call our office if you haven't heard anything after this time frame.  Take care,  Rodman Pickle, NP

## 2023-02-04 NOTE — Assessment & Plan Note (Signed)
He is currently talking with a Child psychotherapist at the [place] and feels like this is helpful. Continue seeing her regularly.

## 2023-02-04 NOTE — Assessment & Plan Note (Signed)
He is still living in his car and a truck. He talked with the social worker at the [facility] about his living situation and she was able to offer him shared-housing, however he declined because he didn't want to live with any one else.

## 2023-02-12 NOTE — Progress Notes (Signed)
NEUROLOGY FOLLOW UP OFFICE NOTE  Gary Gay [id number]  Assessment/Plan:   Left thalamic stroke, likely secondary to small vessel disease Type 2 diabetes mellitus, uncontrolled Cigarette smoker Hypertension Hyperlipidemia Parotid gland mass, incidental finding   1   Secondary stroke prevention as managed by PCP:             - ASA  daily - Hgb A1c goal less than 7             - Statin.  LDL goal less than 70             - Normotensive blood pressure 3.  Mediterranean diet and routine exercise. 4  Smoking cessation 5  Will refer to ENT for follow up of parotid mass. 6  Follow up as needed     Subjective:  Gary Gay is a 67 year old right-handed male with DM II and HTN and cigarette smoker who follows up for thalamic stroke.   UPDATE Current medications:  ASA  daily, atorvastatin , losartan, metformin, duloxetine   Still with right sided upper and lower extremity numbness and tingling but no associated pain.    Has not seen ENT regarding parotid mass  Has not smoked in a couple of days but still smokes.    10/28/2022:  LDL 94 02/04/2023:  Hgb A1c 7.7     HISTORY: He presented to [facility] on 02/24/2022 for 5 day history of right sided facial, arm and leg numbness.  Noted weakness in the right hand, would drop objects.  Sometimes would drag his right foot.  He also endorsed swelling and pain of the right leg as well.  CT head showed small age-indeterminate left parathalamic infarct.  Follow up MRI of brain confirmed acute lacunar infarct in the left thalamus.  CTA of head and neck showed 60% left ICA origin stenosis and severe right vertebral artery origin stenosis but no major intracranial LVO or significant stenosis.  TTE showed EF 60-65% with no obvious source of emboli.  Prior blood work from 01/16/2022 demonstrated LDL 107 and Hgb A1c of 12.8.  He was reportedly taking ASA but not daily.  He was discharged on ASA  and Plavix  DAPT and  started on atorvastatin  daily.  Venous doppler of right leg negative for DVT.  He had an MRI of lumbar spine which showed moderate stenosis at L4-5 and L5-S1.  Imaging revealed parotid mass.  ENT referral recommended.       PAST MEDICAL HISTORY: Past Medical History:  Diagnosis Date   Diabetes mellitus    Hypertension    Stroke     MEDICATIONS: Current Outpatient Medications on File Prior to Visit  Medication Sig Dispense Refill   acetaminophen (TYLENOL) 325 MG tablet Take 650 mg by mouth every 6 (six) hours as needed for mild pain. (Patient not taking: Reported on 01/01/2023)     amitriptyline (ELAVIL) 10 MG tablet Take by mouth.     aspirin EC 81 MG tablet TAKE ONE TABLET BY MOUTH AS DIRECTED BY YOUR MEDICAL PROVIDER     atorvastatin (LIPITOR) 80 MG tablet Take 1 tablet (80 mg total) by mouth daily. 90 tablet 1   clopidogrel (PLAVIX) 75 MG tablet Take by mouth.     cyanocobalamin (VITAMIN B12) 1000 MCG tablet Take 1 tablet (1,000 mcg total) by mouth daily. 90 tablet 1   DULoxetine (CYMBALTA) 30 MG capsule Take 1 capsule (30 mg total) by mouth daily. 30 capsule 1  gabapentin (NEURONTIN) 100 MG capsule Take by mouth.     Iron, Ferrous Sulfate, 325 (65 Fe) MG TABS Take 325 mg by mouth daily. 30 tablet 2   losartan-hydrochlorothiazide (HYZAAR) 50-12.5 MG tablet Take 1 tablet by mouth daily.     metFORMIN (GLUCOPHAGE-XR) 500 MG 24 hr tablet Take 2 tablets (1,000 mg total) by mouth daily with breakfast. 180 tablet 1   No current facility-administered medications on file prior to visit.    ALLERGIES: No Known Allergies  FAMILY HISTORY: Family History  Problem Relation Age of Onset   Cancer Sister        pancreatic   Stroke Brother    Diabetes Maternal Grandmother       Objective:  Blood pressure (!) 150/90, pulse 92, height  (1.702 m), weight 131 lb 12.8 oz (59.8 kg), SpO2 98 %. General: No acute distress.  Patient appears well-groomed.   Head:   Normocephalic/atraumatic Eyes:  Fundi examined but not visualized Neck: supple, no paraspinal tenderness, full range of motion Heart:  Regular rate and rhythm Neurological Exam: alert and oriented to person, place, and time.  Speech fluent and not dysarthric, language intact.  CN II-XII intact. Bulk and tone normal, muscle strength 5/5 throughout.  Hyperesthesia to pinprick and vibration in right hand.  Deep tendon reflexes 2+ throughout, toes downgoing.  Finger to nose testing intact.  Gait steady.  Romberg negative.   Shon Millet, DO  CC: Rodman Pickle, NP

## 2023-02-16 ENCOUNTER — Encounter: Payer: Self-pay | Admitting: Neurology

## 2023-02-16 ENCOUNTER — Ambulatory Visit (INDEPENDENT_AMBULATORY_CARE_PROVIDER_SITE_OTHER): Payer: Medicare HMO | Admitting: Neurology

## 2023-02-16 VITALS — BP 150/90 | HR 92 | Ht 67.0 in | Wt 131.8 lb

## 2023-02-16 DIAGNOSIS — K118 Other diseases of salivary glands: Secondary | ICD-10-CM | POA: Diagnosis not present

## 2023-02-16 DIAGNOSIS — I6381 Other cerebral infarction due to occlusion or stenosis of small artery: Secondary | ICD-10-CM | POA: Diagnosis not present

## 2023-02-16 DIAGNOSIS — I10 Essential (primary) hypertension: Secondary | ICD-10-CM

## 2023-02-16 DIAGNOSIS — E1165 Type 2 diabetes mellitus with hyperglycemia: Secondary | ICD-10-CM | POA: Diagnosis not present

## 2023-02-16 DIAGNOSIS — F17200 Nicotine dependence, unspecified, uncomplicated: Secondary | ICD-10-CM | POA: Diagnosis not present

## 2023-02-16 DIAGNOSIS — Z794 Long term (current) use of insulin: Secondary | ICD-10-CM

## 2023-02-16 DIAGNOSIS — E785 Hyperlipidemia, unspecified: Secondary | ICD-10-CM

## 2023-02-16 NOTE — Patient Instructions (Signed)
Aspirin  daily Continue cholesterol, diabetes and blood pressure medication.  Follow up with PCP regarding blood pressure Refer to ENT regarding parotid mass Try to quit smoking Mediterranean diet Routine cardiovascular exercise Follow up as needed   Mediterranean Diet A Mediterranean diet refers to food and lifestyle choices that are based on the traditions of countries located on the [employer]. It focuses on eating more fruits, vegetables, whole grains, beans, nuts, seeds, and heart-healthy fats, and eating less dairy, meat, eggs, and processed foods with added sugar, salt, and fat. This way of eating has been shown to help prevent certain conditions and improve outcomes for people who have chronic diseases, like kidney disease and heart disease. What are tips for following this plan? Reading food labels Check the serving size of packaged foods. For foods such as rice and pasta, the serving size refers to the amount of cooked product, not dry. Check the total fat in packaged foods. Avoid foods that have saturated fat or trans fats. Check the ingredient list for added sugars, such as corn syrup. Shopping  Buy a variety of foods that offer a balanced diet, including: Fresh fruits and vegetables (produce). Grains, beans, nuts, and seeds. Some of these may be available in unpackaged forms or large amounts (in bulk). Fresh seafood. Poultry and eggs. Low-fat dairy products. Buy whole ingredients instead of prepackaged foods. Buy fresh fruits and vegetables in-season from local farmers markets. Buy plain frozen fruits and vegetables. If you do not have access to quality fresh seafood, buy precooked frozen shrimp or canned fish, such as tuna, salmon, or sardines. Stock your pantry so you always have certain foods on hand, such as olive oil, canned tuna, canned tomatoes, rice, pasta, and beans. Cooking Cook foods with extra-virgin olive oil instead of using butter or other  vegetable oils. Have meat as a side dish, and have vegetables or grains as your main dish. This means having meat in small portions or adding small amounts of meat to foods like pasta or stew. Use beans or vegetables instead of meat in common dishes like chili or lasagna. Experiment with different cooking methods. Try roasting, broiling, steaming, and sauting vegetables. Add frozen vegetables to soups, stews, pasta, or rice. Add nuts or seeds for added healthy fats and plant protein at each meal. You can add these to yogurt, salads, or vegetable dishes. Marinate fish or vegetables using olive oil, lemon juice, garlic, and fresh herbs. Meal planning Plan to eat one vegetarian meal one day each week. Try to work up to two vegetarian meals, if possible. Eat seafood two or more times a week. Have healthy snacks readily available, such as: Vegetable sticks with hummus. Greek yogurt. Fruit and nut trail mix. Eat balanced meals throughout the week. This includes: Fruit: 2-3 servings a day. Vegetables: 4-5 servings a day. Low-fat dairy: 2 servings a day. Fish, poultry, or lean meat: 1 serving a day. Beans and legumes: 2 or more servings a week. Nuts and seeds: 1-2 servings a day. Whole grains: 6-8 servings a day. Extra-virgin olive oil: 3-4 servings a day. Limit red meat and sweets to only a few servings a month. Lifestyle  Cook and eat meals together with your family, when possible. Drink enough fluid to keep your urine pale yellow. Be physically active every day. This includes: Aerobic exercise like running or swimming. Leisure activities like gardening, walking, or housework. Get 7-8 hours of sleep each night. If recommended by your health care provider, drink red wine in  moderation. This means 1 glass a day for nonpregnant women and 2 glasses a day for men. A glass of wine equals 5 oz (150 mL). What foods should I eat? Fruits Apples. Apricots. Avocado. Berries. Bananas. Cherries.  Dates. Figs. Grapes. Lemons. Melon. Oranges. Peaches. Plums. Pomegranate. Vegetables Artichokes. Beets. Broccoli. Cabbage. Carrots. Eggplant. Green beans. Chard. Kale. Spinach. Onions. Leeks. Peas. Squash. Tomatoes. Peppers. Radishes. Grains Whole-grain pasta. Brown rice. Bulgur wheat. Polenta. Couscous. Whole-wheat bread. Orpah Cobb. Meats and other proteins Beans. Almonds. Sunflower seeds. Pine nuts. Peanuts. Cod. Salmon. Scallops. Shrimp. Tuna. Tilapia. Clams. Oysters. Eggs. Poultry without skin. Dairy Low-fat milk. Cheese. Greek yogurt. Fats and oils Extra-virgin olive oil. Avocado oil. Grapeseed oil. Beverages Water. Red wine. Herbal tea. Sweets and desserts Greek yogurt with honey. Baked apples. Poached pears. Trail mix. Seasonings and condiments Basil. Cilantro. Coriander. Cumin. Mint. Parsley. Sage. Rosemary. Tarragon. Garlic. Oregano. Thyme. Pepper. Balsamic vinegar. Tahini. Hummus. Tomato sauce. Olives. Mushrooms. The items listed above may not be a complete list of foods and beverages you can eat. Contact a dietitian for more information. What foods should I limit? This is a list of foods that should be eaten rarely or only on special occasions. Fruits Fruit canned in syrup. Vegetables Deep-fried potatoes (french fries). Grains Prepackaged pasta or rice dishes. Prepackaged cereal with added sugar. Prepackaged snacks with added sugar. Meats and other proteins Beef. Pork. Lamb. Poultry with skin. Hot dogs. [name]. Dairy Ice cream. Sour cream. Whole milk. Fats and oils Butter. Canola oil. Vegetable oil. Beef fat (tallow). Lard. Beverages Juice. Sugar-sweetened soft drinks. Beer. Liquor and spirits. Sweets and desserts Cookies. Cakes. Pies. Candy. Seasonings and condiments Mayonnaise. Pre-made sauces and marinades. The items listed above may not be a complete list of foods and beverages you should limit. Contact a dietitian for more information. Summary The  Mediterranean diet includes both food and lifestyle choices. Eat a variety of fresh fruits and vegetables, beans, nuts, seeds, and whole grains. Limit the amount of red meat and sweets that you eat. If recommended by your health care provider, drink red wine in moderation. This means 1 glass a day for nonpregnant women and 2 glasses a day for men. A glass of wine equals 5 oz (150 mL). This information is not intended to replace advice given to you by your health care provider. Make sure you discuss any questions you have with your health care provider. Document Revised: 11/18/2019 Document Reviewed: 09/15/2019 Elsevier Patient Education  2023 [employer].

## 2023-02-25 ENCOUNTER — Other Ambulatory Visit: Payer: Self-pay | Admitting: Nurse Practitioner

## 2023-02-25 NOTE — Telephone Encounter (Signed)
Refill request for  Duloxetine 30 mg LR  01/01/23, #30, 1 rf LOV  02/04/23 FOV  05/06/23  Please review and advise.  Thanks. Dm/cma

## 2023-03-29 ENCOUNTER — Other Ambulatory Visit: Payer: Self-pay | Admitting: Nurse Practitioner

## 2023-03-30 NOTE — Telephone Encounter (Signed)
Requesting: LOSARTAN/HCTZ 50/12.5MG  TABLETS  Last Visit: 02/04/2023 Next Visit: 05/06/2023 Last Refill: 09/30/2022  Please Advise

## 2023-04-15 ENCOUNTER — Ambulatory Visit (INDEPENDENT_AMBULATORY_CARE_PROVIDER_SITE_OTHER): Payer: Medicare HMO | Admitting: Nurse Practitioner

## 2023-04-15 ENCOUNTER — Telehealth: Payer: Self-pay

## 2023-04-15 ENCOUNTER — Encounter: Payer: Self-pay | Admitting: Nurse Practitioner

## 2023-04-15 VITALS — BP 144/84 | HR 76 | Temp 97.3°F | Ht 67.0 in | Wt 126.2 lb

## 2023-04-15 DIAGNOSIS — E785 Hyperlipidemia, unspecified: Secondary | ICD-10-CM

## 2023-04-15 DIAGNOSIS — Z5900 Homelessness unspecified: Secondary | ICD-10-CM | POA: Diagnosis not present

## 2023-04-15 DIAGNOSIS — E119 Type 2 diabetes mellitus without complications: Secondary | ICD-10-CM | POA: Diagnosis not present

## 2023-04-15 DIAGNOSIS — I10 Essential (primary) hypertension: Secondary | ICD-10-CM

## 2023-04-15 DIAGNOSIS — Z7984 Long term (current) use of oral hypoglycemic drugs: Secondary | ICD-10-CM | POA: Diagnosis not present

## 2023-04-15 DIAGNOSIS — Z1159 Encounter for screening for other viral diseases: Secondary | ICD-10-CM | POA: Diagnosis not present

## 2023-04-15 DIAGNOSIS — E538 Deficiency of other specified B group vitamins: Secondary | ICD-10-CM

## 2023-04-15 DIAGNOSIS — Z8673 Personal history of transient ischemic attack (TIA), and cerebral infarction without residual deficits: Secondary | ICD-10-CM | POA: Diagnosis not present

## 2023-04-15 DIAGNOSIS — Z125 Encounter for screening for malignant neoplasm of prostate: Secondary | ICD-10-CM | POA: Diagnosis not present

## 2023-04-15 DIAGNOSIS — D649 Anemia, unspecified: Secondary | ICD-10-CM

## 2023-04-15 LAB — COMPREHENSIVE METABOLIC PANEL
ALT: 7 U/L (ref 0–53)
AST: 25 U/L (ref 0–37)
Albumin: 4.4 g/dL (ref 3.5–5.2)
Alkaline Phosphatase: 78 U/L (ref 39–117)
BUN: 4 mg/dL — ABNORMAL LOW (ref 6–23)
CO2: 30 mEq/L (ref 19–32)
Calcium: 9.7 mg/dL (ref 8.4–10.5)
Chloride: 100 mEq/L (ref 96–112)
Creatinine, Ser: 1.12 mg/dL (ref 0.40–1.50)
GFR: 68.34 mL/min (ref >60.00)
Glucose, Bld: 200 mg/dL — ABNORMAL HIGH (ref 70–99)
Potassium: 3.3 mEq/L — ABNORMAL LOW (ref 3.5–5.1)
Sodium: 141 mEq/L (ref 135–145)
Total Bilirubin: 0.6 mg/dL (ref 0.2–1.2)
Total Protein: 8.3 g/dL (ref 6.0–8.3)

## 2023-04-15 LAB — CBC WITH DIFFERENTIAL/PLATELET
Basophils Absolute: 0 10*3/uL (ref 0.0–0.1)
Basophils Relative: 0.7 % (ref 0.0–3.0)
Eosinophils Absolute: 0.2 10*3/uL (ref 0.0–0.7)
Eosinophils Relative: 3.8 % (ref 0.0–5.0)
HCT: 36.4 % — ABNORMAL LOW (ref 39.0–52.0)
Hemoglobin: 11.8 g/dL — ABNORMAL LOW (ref 13.0–17.0)
Lymphocytes Relative: 40.6 % (ref 12.0–46.0)
Lymphs Abs: 1.9 10*3/uL (ref 0.7–4.0)
MCHC: 32.5 g/dL (ref 30.0–36.0)
MCV: 84.4 fl (ref 78.0–100.0)
Monocytes Absolute: 0.4 10*3/uL (ref 0.1–1.0)
Monocytes Relative: 8.1 % (ref 3.0–12.0)
Neutro Abs: 2.1 10*3/uL (ref 1.4–7.7)
Neutrophils Relative %: 46.8 % (ref 43.0–77.0)
Platelets: 316 10*3/uL (ref 150.0–400.0)
RBC: 4.31 Mil/uL (ref 4.22–5.81)
RDW: 16.1 % — ABNORMAL HIGH (ref 11.5–15.5)
WBC: 4.6 10*3/uL (ref 4.0–10.5)

## 2023-04-15 LAB — MICROALBUMIN / CREATININE URINE RATIO
Creatinine,U: 50.3 mg/dL
Microalb Creat Ratio: 1.4 mg/g (ref 0.0–30.0)
Microalb, Ur: <0.7 mg/dL (ref 0.0–1.9)

## 2023-04-15 LAB — LIPID PANEL
Cholesterol: 198 mg/dL (ref 0–200)
HDL: 44.9 mg/dL (ref >39.00)
LDL Cholesterol: 118 mg/dL — ABNORMAL HIGH (ref 0–99)
NonHDL: 153.51
Total CHOL/HDL Ratio: 4
Triglycerides: 176 mg/dL — ABNORMAL HIGH (ref 0.0–149.0)
VLDL: 35.2 mg/dL (ref 0.0–40.0)

## 2023-04-15 LAB — PSA: PSA: 0.87 ng/mL (ref 0.10–4.00)

## 2023-04-15 LAB — VITAMIN B12: Vitamin B-12: 192 pg/mL — ABNORMAL LOW (ref 211–911)

## 2023-04-15 LAB — HEMOGLOBIN A1C: Hgb A1c MFr Bld: 8.3 % — ABNORMAL HIGH (ref 4.6–6.5)

## 2023-04-15 MED ORDER — LOSARTAN POTASSIUM-HCTZ 50-12.5 MG PO TABS: 1.0000 | ORAL_TABLET | Freq: Every day | ORAL | 3 refills | Status: DC

## 2023-04-15 MED ORDER — CYANOCOBALAMIN 1000 MCG/ML IJ SOLN
1000.0000 ug | Freq: Once | INTRAMUSCULAR | Status: AC
Start: 2023-04-15 — End: 2023-04-15
  Administered 2023-04-15: 1000 ug via INTRAMUSCULAR

## 2023-04-15 MED ORDER — ATORVASTATIN CALCIUM 80 MG PO TABS
80.0000 mg | ORAL_TABLET | Freq: Every day | ORAL | 3 refills | Status: DC
Start: 2023-04-15 — End: 2024-06-02

## 2023-04-15 NOTE — Assessment & Plan Note (Signed)
Continue atorvastatin 80 mg daily and we will check lipid panel today. 

## 2023-04-15 NOTE — Patient Instructions (Signed)
It was great to see you!  I have refilled your medications to the pharmacy.   We are starting you on B12 injections.   We are checking your labs today and will let you know the results via mychart/phone.   Let's follow-up in 3 months, sooner if you have concerns.  If a referral was placed today, you will be contacted for an appointment. Please note that routine referrals can sometimes take up to 3-4 weeks to process. Please call our office if you haven't heard anything after this time frame.  Take care,  Rodman Pickle, NP

## 2023-04-15 NOTE — Telephone Encounter (Signed)
I called and LVM for Gary Gay to RTC to get patient scheduled for Retinal Eye Screening

## 2023-04-15 NOTE — Assessment & Plan Note (Signed)
Chronic, not controlled.  He has only been taking his metformin XR 1000 mg every other day.  Encouraged him to take this daily.  Will check A1c, CMP, CBC, lipid panel, urine microalbumin today.  Foot exam done today was normal.  Discussed pneumonia vaccine, however he declined.  Will schedule him for the eye clinic at our office in October.  Follow-up in 3 months.

## 2023-04-15 NOTE — Assessment & Plan Note (Signed)
Chronic, not controlled. He has not been taking his medication. Encouraged him to take this daily. Losartan-hydrochlorothiazide 50-12.5mg  daily refill sent to the pharmacy. Check CMP, CBC today. Follow-up in 3 months.

## 2023-04-15 NOTE — Telephone Encounter (Signed)
Gary Gay had sent me a message that she had called and LVM for Gary Gay to return call to schedule an appoinmtment.

## 2023-04-15 NOTE — Assessment & Plan Note (Signed)
He is still living in his car and a truck. He talked with the social worker at the [facility] about his living situation and she was able to offer him shared-housing, however he declined because he didn't want to live with any one else.  

## 2023-04-15 NOTE — Assessment & Plan Note (Signed)
He has not been taking the iron supplement.  Will check CBC and iron panel today. 

## 2023-04-15 NOTE — Assessment & Plan Note (Signed)
He has a history of CVA.  He saw neurology in 01/2023.  Continue aspirin 81 mg daily.  Check lipid panel today.  Refill sent in on atorvastatin 80 mg daily.

## 2023-04-15 NOTE — Progress Notes (Signed)
Established Patient Office Visit  Subjective   Patient ID: Gary Gay, male    DOB: 13-Nov-1955  Age: 67 y.o. MRN: [id number]  Chief Complaint  Patient presents with   Diabetes    Follow up, numbness sensation on right side of body since April    HPI  Gary Gay is here to follow-up on diabetes. He is also still having ongoing numbness and tingling on his right arm, side, and right upper thigh since his stroke. He states that he has not been taking his medications except his metformin every other day.   DIABETES  Hypoglycemic episodes:no Polydipsia/polyuria: no Visual disturbance: no Chest pain: no Paresthesias: yes - right side of body Glucose Monitoring: no Taking Insulin?: no Blood Pressure Monitoring: not checking Retinal Examination: Not up to Date Foot Exam: Not up to Date Diabetic Education: Completed Pneumovax: Not up to Date Influenza: Not up to Date Aspirin: no    ROS See pertinent positives and negatives per HPI.    Objective:     BP (!) 144/84 (BP Location: Left Arm, Cuff Size: Normal)   Pulse 76   Temp (!) 97.3 F (36.3 C)   Ht 5\' 7"  (1.702 m)   Wt 126 lb 3.2 oz (57.2 kg)   SpO2 99%   BMI 19.77 kg/m    Physical Exam Vitals and nursing note reviewed.  Constitutional:      Appearance: Normal appearance.  HENT:     Head: Normocephalic.  Eyes:     Conjunctiva/sclera: Conjunctivae normal.  Cardiovascular:     Rate and Rhythm: Normal rate and regular rhythm.     Pulses: Normal pulses.     Heart sounds: Normal heart sounds.  Pulmonary:     Effort: Pulmonary effort is normal.     Breath sounds: Normal breath sounds.  Musculoskeletal:     Cervical back: Normal range of motion.  Skin:    General: Skin is warm.  Neurological:     General: No focal deficit present.     Mental Status: He is alert and oriented to person, place, and time.  Psychiatric:        Mood and Affect: Mood normal.        Behavior: Behavior normal.        Thought  Content: Thought content normal.        Judgment: Judgment normal.    Diabetic Foot Exam - Simple   Simple Foot Form Visual Inspection No deformities, no ulcerations, no other skin breakdown bilaterally: Yes Sensation Testing Intact to touch and monofilament testing bilaterally: Yes Pulse Check Posterior Tibialis and Dorsalis pulse intact bilaterally: Yes Comments     No results found for any visits on 04/15/23.    The ASCVD Risk score (Arnett DK, et al., 2019) failed to calculate for the following reasons:   The patient has a prior MI or stroke diagnosis    Assessment & Plan:   Problem List Items Addressed This Visit       Cardiovascular and Mediastinum   Essential hypertension - Primary (Chronic)    Chronic, not controlled. He has not been taking his medication. Encouraged him to take this daily. Losartan-hydrochlorothiazide 50-12.5mg  daily refill sent to the pharmacy. Check CMP, CBC today. Follow-up in 3 months.       Relevant Medications   losartan-hydrochlorothiazide (HYZAAR) 50-12.5 MG tablet   atorvastatin (LIPITOR) 80 MG tablet   Other Relevant Orders   CBC with Differential/Platelet   Comprehensive metabolic panel  Endocrine   Diabetes mellitus without complication (HCC)    Chronic, not controlled.  He has only been taking his metformin XR 1000 mg every other day.  Encouraged him to take this daily.  Will check A1c, CMP, CBC, lipid panel, urine microalbumin today.  Foot exam done today was normal.  Discussed pneumonia vaccine, however he declined.  Will schedule him for the eye clinic at our office in October.  Follow-up in 3 months.      Relevant Medications   losartan-hydrochlorothiazide (HYZAAR) 50-12.5 MG tablet   atorvastatin (LIPITOR) 80 MG tablet   Other Relevant Orders   CBC with Differential/Platelet   Comprehensive metabolic panel   Hemoglobin A1c   Microalbumin / creatinine urine ratio     Other   Dyslipidemia (Chronic)    Continue  atorvastatin 80 mg daily and we will check lipid panel today.      Relevant Medications   atorvastatin (LIPITOR) 80 MG tablet   Other Relevant Orders   CBC with Differential/Platelet   Comprehensive metabolic panel   Lipid panel   History of CVA (cerebrovascular accident)    He has a history of CVA.  He saw neurology in 01/2023.  Continue aspirin 81 mg daily.  Check lipid panel today.  Refill sent in on atorvastatin 80 mg daily.      Anemia    He has not been taking the iron supplement.  Will check CBC and iron panel today.      Relevant Orders   Iron, TIBC and Ferritin Panel   Homeless    He is still living in his car and a truck. He talked with the social worker at the [place] about his living situation and she was able to offer him shared-housing, however he declined because he didn't want to live with any one else.       Vitamin B12 deficiency    Will check vitamin B12 levels today.  He has not been able to take the oral supplement.  Will start him on injections's, starting today.  Will have him come back for weekly injections for 3 more weeks, then monthly injections.  Follow-up in 3 months.      Relevant Orders   Vitamin B12   Other Visit Diagnoses     Encounter for hepatitis C screening test for low risk patient       Screen hepatitis C today   Relevant Orders   Hepatitis C antibody   Screening PSA (prostate specific antigen)       Screen PSA today   Relevant Orders   PSA       Return in about 3 months (around 07/16/2023) for Diabetes.    Gerre Scull, NP

## 2023-04-15 NOTE — Assessment & Plan Note (Signed)
Will check vitamin B12 levels today.  He has not been able to take the oral supplement.  Will start him on injections's, starting today.  Will have him come back for weekly injections for 3 more weeks, then monthly injections.  Follow-up in 3 months.

## 2023-04-16 ENCOUNTER — Telehealth: Payer: Self-pay | Admitting: Nurse Practitioner

## 2023-04-16 LAB — IRON,TIBC AND FERRITIN PANEL
%SAT: 16 % (calc) — ABNORMAL LOW (ref 20–48)
Ferritin: 14 ng/mL — ABNORMAL LOW (ref 24–380)
Iron: 53 ug/dL (ref 50–180)
TIBC: 334 mcg/dL (calc) (ref 250–425)

## 2023-04-16 LAB — HEPATITIS C ANTIBODY: Hepatitis C Ab: NONREACTIVE

## 2023-04-16 NOTE — Telephone Encounter (Signed)
Pt called and stated that he missed your call earlier because he was at the [facility] . Please return his call

## 2023-04-16 NOTE — Telephone Encounter (Signed)
I called patient back and he said that the [place] office will be in contact to get medical records

## 2023-04-16 NOTE — Telephone Encounter (Signed)
I returned patient's call. 

## 2023-04-16 NOTE — Telephone Encounter (Signed)
Pt is needing a cb concerning getting copies of the last few bp readings. Please advise pt at [phone].

## 2023-04-26 ENCOUNTER — Other Ambulatory Visit: Payer: Self-pay | Admitting: Nurse Practitioner

## 2023-05-06 ENCOUNTER — Ambulatory Visit (INDEPENDENT_AMBULATORY_CARE_PROVIDER_SITE_OTHER): Payer: Medicare HMO | Admitting: Nurse Practitioner

## 2023-05-06 ENCOUNTER — Encounter: Payer: Self-pay | Admitting: Nurse Practitioner

## 2023-05-06 VITALS — BP 136/82 | HR 86 | Temp 98.2°F | Ht 67.0 in | Wt 125.2 lb

## 2023-05-06 DIAGNOSIS — I10 Essential (primary) hypertension: Secondary | ICD-10-CM | POA: Diagnosis not present

## 2023-05-06 DIAGNOSIS — Z7984 Long term (current) use of oral hypoglycemic drugs: Secondary | ICD-10-CM | POA: Diagnosis not present

## 2023-05-06 DIAGNOSIS — E119 Type 2 diabetes mellitus without complications: Secondary | ICD-10-CM | POA: Diagnosis not present

## 2023-05-06 LAB — POCT GLUCOSE (DEVICE FOR HOME USE)
Glucose Fasting, POC: 217 mg/dL — AB (ref 70–99)
POC Glucose: 217 mg/dl — AB (ref 70–99)

## 2023-05-06 NOTE — Patient Instructions (Signed)
It was great to see you!  Try to remember to take your medicines every day.   Let's follow-up in 3 months, sooner if you have concerns.  If a referral was placed today, you will be contacted for an appointment. Please note that routine referrals can sometimes take up to 3-4 weeks to process. Please call our office if you haven't heard anything after this time frame.  Take care,  Rodman Pickle, NP

## 2023-05-06 NOTE — Progress Notes (Signed)
Established Patient Office Visit  Subjective   Patient ID: Eliberto Sole, male    DOB: November 06, 1955  Age: 67 y.o. MRN: [id number]  Chief Complaint  Patient presents with   Hypertension    Follow up   HPI:  Toddy Boyd is here to follow-up on diabetes and hypertension.   DIABETES  Hypoglycemic episodes:no Polydipsia/polyuria: no Visual disturbance: no Chest pain: no Paresthesias: no Glucose Monitoring: no  Accucheck frequency: Not Checking Taking Insulin?: no Blood Pressure Monitoring: not checking Retinal Examination: Not up to Date Foot Exam: Not up to Date Diabetic Education: Completed Pneumovax:  declined Influenza:  postponed to flu season Aspirin: yes  HYPERTENSION  Hypertension status: controlled  Satisfied with current treatment? yes Duration of hypertension: chronic BP monitoring frequency:  not checking BP medication side effects:  no Medication compliance: poor compliance Previous BP meds:losartan - hctz Aspirin: yes Recurrent headaches: no Visual changes: no Palpitations: no Dyspnea: no Chest pain: no Lower extremity edema: no Dizzy/lightheaded: no     ROS See pertinent positives and negatives per HPI.    Objective:     BP 136/82 (BP Location: Left Arm, Cuff Size: Normal)   Pulse 86   Temp 98.2 F (36.8 C)   Ht 5\' 7"  (1.702 m)   Wt 125 lb 3.2 oz (56.8 kg)   SpO2 99%   BMI 19.61 kg/m  BP Readings from Last 3 Encounters:  05/06/23 136/82  04/15/23 (!) 144/84  02/16/23 (!) 150/90   Wt Readings from Last 3 Encounters:  05/06/23 125 lb 3.2 oz (56.8 kg)  04/15/23 126 lb 3.2 oz (57.2 kg)  02/16/23 131 lb 12.8 oz (59.8 kg)      Physical Exam Vitals and nursing note reviewed.  Constitutional:      Appearance: Normal appearance.  HENT:     Head: Normocephalic.  Eyes:     Conjunctiva/sclera: Conjunctivae normal.  Cardiovascular:     Rate and Rhythm: Normal rate and regular rhythm.     Pulses: Normal pulses.     Heart sounds:  Normal heart sounds.  Pulmonary:     Effort: Pulmonary effort is normal.     Breath sounds: Normal breath sounds.  Musculoskeletal:     Cervical back: Normal range of motion.  Skin:    General: Skin is warm.  Neurological:     General: No focal deficit present.     Mental Status: He is alert and oriented to person, place, and time.  Psychiatric:        Mood and Affect: Mood normal.        Behavior: Behavior normal.        Thought Content: Thought content normal.        Judgment: Judgment normal.    Diabetic Foot Exam - Simple   Simple Foot Form Visual Inspection No deformities, no ulcerations, no other skin breakdown bilaterally: Yes Sensation Testing Intact to touch and monofilament testing bilaterally: Yes Pulse Check Posterior Tibialis and Dorsalis pulse intact bilaterally: Yes Comments     Results for orders placed or performed in visit on 05/06/23  POCT Glucose (Device for Home Use)  Result Value Ref Range   Glucose Fasting, POC 217 (A) 70 - 99 mg/dL   POC Glucose 213 (A) 70 - 99 mg/dl      The ASCVD Risk score (Arnett DK, et al., 2019) failed to calculate for the following reasons:   The patient has a prior MI or stroke diagnosis    Assessment & Plan:  Problem List Items Addressed This Visit       Cardiovascular and Mediastinum   Essential hypertension (Chronic)    Chronic, stable. Continue losartan-hydrochlorothiazide 50-12.5mg  daily. Follow-up in 6 months.         Endocrine   Diabetes mellitus without complication (HCC) - Primary    Chronic, not controlled. His glucose in the office today was 217 (not fasting). Encouraged him to take his metformin regularly 1,000mg  daily. Foot exam normal. He is scheduled for the eye clinic on 08/13/23 at 10:30am. Follow-up in 3 months.       Relevant Orders   POCT Glucose (Device for Home Use) (Completed)    Return in about 3 months (around 08/06/2023) for Diabetes, HTN.    Gerre Scull, NP

## 2023-05-06 NOTE — Assessment & Plan Note (Signed)
Chronic, stable. Continue losartan-hydrochlorothiazide 50-12.5mg  daily. Follow-up in 6 months.

## 2023-05-06 NOTE — Assessment & Plan Note (Signed)
Chronic, not controlled. His glucose in the office today was 217 (not fasting). Encouraged him to take his metformin regularly 1,000mg  daily. Foot exam normal. He is scheduled for the eye clinic on 08/13/23 at 10:30am. Follow-up in 3 months.

## 2023-05-11 DIAGNOSIS — I10 Essential (primary) hypertension: Secondary | ICD-10-CM | POA: Diagnosis not present

## 2023-05-11 DIAGNOSIS — E119 Type 2 diabetes mellitus without complications: Secondary | ICD-10-CM | POA: Diagnosis not present

## 2023-05-11 DIAGNOSIS — Z0000 Encounter for general adult medical examination without abnormal findings: Secondary | ICD-10-CM | POA: Diagnosis not present

## 2023-05-11 DIAGNOSIS — Z5941 Food insecurity: Secondary | ICD-10-CM | POA: Diagnosis not present

## 2023-05-11 DIAGNOSIS — Z5900 Homelessness unspecified: Secondary | ICD-10-CM | POA: Diagnosis not present

## 2023-05-11 DIAGNOSIS — Z8673 Personal history of transient ischemic attack (TIA), and cerebral infarction without residual deficits: Secondary | ICD-10-CM | POA: Diagnosis not present

## 2023-05-11 DIAGNOSIS — E785 Hyperlipidemia, unspecified: Secondary | ICD-10-CM | POA: Diagnosis not present

## 2023-05-11 DIAGNOSIS — F17200 Nicotine dependence, unspecified, uncomplicated: Secondary | ICD-10-CM | POA: Diagnosis not present

## 2023-08-06 ENCOUNTER — Encounter: Payer: Self-pay | Admitting: Nurse Practitioner

## 2023-08-06 ENCOUNTER — Ambulatory Visit: Payer: Medicare HMO | Admitting: Nurse Practitioner

## 2023-08-06 VITALS — BP 122/80 | HR 91 | Temp 97.3°F | Ht 67.0 in | Wt 129.0 lb

## 2023-08-06 DIAGNOSIS — Z5900 Homelessness unspecified: Secondary | ICD-10-CM

## 2023-08-06 DIAGNOSIS — Z7984 Long term (current) use of oral hypoglycemic drugs: Secondary | ICD-10-CM

## 2023-08-06 DIAGNOSIS — I10 Essential (primary) hypertension: Secondary | ICD-10-CM

## 2023-08-06 DIAGNOSIS — E119 Type 2 diabetes mellitus without complications: Secondary | ICD-10-CM

## 2023-08-06 LAB — POCT GLYCOSYLATED HEMOGLOBIN (HGB A1C)
HbA1c POC (<> result, manual entry): 7.2 % (ref 4.0–5.6)
HbA1c, POC (controlled diabetic range): 7.2 % — AB (ref 0.0–7.0)
HbA1c, POC (prediabetic range): 7.2 % — AB (ref 5.7–6.4)
Hemoglobin A1C: 7.2 % — AB (ref 4.0–5.6)

## 2023-08-06 NOTE — Patient Instructions (Signed)
It was great to see you!  You have an eye appointment scheduled at our office on 08/13/23 at 10:30am   Keep up the great work!   Let's follow-up in 3 months, sooner if you have concerns.  If a referral was placed today, you will be contacted for an appointment. Please note that routine referrals can sometimes take up to 3-4 weeks to process. Please call our office if you haven't heard anything after this time frame.  Take care,  Rodman Pickle, NP

## 2023-08-06 NOTE — Assessment & Plan Note (Signed)
Continue metformin XR 500 mg daily.

## 2023-08-06 NOTE — Assessment & Plan Note (Signed)
Chronic, stable. Continue losartan-hydrochlorothiazide 50-12.5mg  daily. Follow-up in 6 months.

## 2023-08-06 NOTE — Assessment & Plan Note (Signed)
He is currently staying with his cousin. He states that it is not the best option, but will stay here as long as he can.

## 2023-08-06 NOTE — Assessment & Plan Note (Signed)
Chronic, stable. A1c today is 7.2% today. Continue metformin 500mg  daily. He has an eye appointment scheduled for next week. Declines flu and pneumonia vaccines. Follow-up in 3 months.

## 2023-08-06 NOTE — Progress Notes (Signed)
Established Patient Office Visit  Subjective   Patient ID: Gary Gay, male    DOB: 1956/03/05  Age: 67 y.o. MRN: [id number]  Chief Complaint  Patient presents with   Diabetes and HTN    Follow up    HPI  Gary Gay is here to follow-up on hypertension and diabetes.   HYPERTENSION / HYPERLIPIDEMIA  Satisfied with current treatment? yes Duration of hypertension: chronic BP monitoring frequency: not checking BP range: n/a BP medication side effects: no Past BP meds: losartan-hydrochlorothiazide Duration of hyperlipidemia: chronic Cholesterol medication side effects: no Cholesterol supplements: none Past cholesterol medications: atorvastatin Medication compliance: fair compliance Aspirin: yes Recent stressors: no Recurrent headaches: no Visual changes: no Palpitations: no Dyspnea: no Chest pain: no Lower extremity edema: no Dizzy/lightheaded: no  DIABETES  Hypoglycemic episodes:no Polydipsia/polyuria: no Visual disturbance: no Chest pain: no Paresthesias: yes right side after stroke Glucose Monitoring: no  Accucheck frequency: Not Checking Taking Insulin?: no Blood Pressure Monitoring: not checking Retinal Examination: Not up to Date Foot Exam: Up to Date Diabetic Education: Completed Pneumovax: Not up to Date Influenza: Not up to Date Aspirin: yes    ROS See pertinent positives and negatives per HPI.    Objective:     BP 122/80 (BP Location: Left Arm)   Pulse 91   Temp (!) 97.3 F (36.3 C)   Ht 5\' 7"  (1.702 m)   Wt 129 lb (58.5 kg)   SpO2 100%   BMI 20.20 kg/m  BP Readings from Last 3 Encounters:  08/06/23 122/80  05/06/23 136/82  04/15/23 (!) 144/84   Wt Readings from Last 3 Encounters:  08/06/23 129 lb (58.5 kg)  05/06/23 125 lb 3.2 oz (56.8 kg)  04/15/23 126 lb 3.2 oz (57.2 kg)      Physical Exam Vitals and nursing note reviewed.  Constitutional:      Appearance: Normal appearance.  HENT:     Head: Normocephalic.   Eyes:     Conjunctiva/sclera: Conjunctivae normal.  Cardiovascular:     Rate and Rhythm: Normal rate and regular rhythm.     Pulses: Normal pulses.     Heart sounds: Normal heart sounds.  Pulmonary:     Effort: Pulmonary effort is normal.     Breath sounds: Normal breath sounds.  Musculoskeletal:     Cervical back: Normal range of motion.  Skin:    General: Skin is warm.  Neurological:     General: No focal deficit present.     Mental Status: He is alert and oriented to person, place, and time.  Psychiatric:        Mood and Affect: Mood normal.        Behavior: Behavior normal.        Thought Content: Thought content normal.        Judgment: Judgment normal.      Results for orders placed or performed in visit on 08/06/23  POCT glycosylated hemoglobin (Hb A1C)  Result Value Ref Range   Hemoglobin A1C 7.2 (A) 4.0 - 5.6 %   HbA1c POC (<> result, manual entry) 7.2 4.0 - 5.6 %   HbA1c, POC (prediabetic range) 7.2 (A) 5.7 - 6.4 %   HbA1c, POC (controlled diabetic range) 7.2 (A) 0.0 - 7.0 %      The ASCVD Risk score (Arnett DK, et al., 2019) failed to calculate for the following reasons:   The patient has a prior MI or stroke diagnosis    Assessment & Plan:   Problem  List Items Addressed This Visit       Cardiovascular and Mediastinum   Essential hypertension - Primary (Chronic)    Chronic, stable. Continue losartan-hydrochlorothiazide 50-12.5mg  daily. Follow-up in 6 months.         Endocrine   Diabetes mellitus without complication (HCC)    Chronic, stable. A1c today is 7.2% today. Continue metformin 500mg  daily. He has an eye appointment scheduled for next week. Declines flu and pneumonia vaccines. Follow-up in 3 months.       Relevant Orders   POCT glycosylated hemoglobin (Hb A1C) (Completed)     Other   Homeless    He is currently staying with his cousin. He states that it is not the best option, but will stay here as long as he can.       Long term  current use of oral hypoglycemic drug    Continue metformin XR 500mg  daily.        Return in about 3 months (around 11/06/2023) for Diabetes.    Gary Scull, NP

## 2024-01-04 ENCOUNTER — Telehealth: Payer: Self-pay

## 2024-01-04 NOTE — Telephone Encounter (Signed)
 I called and spoke with patient and he is wanting to speak with someone to get his appointment for Welcome to Medicare Visit rescheduled. He was on another call and wanted me to call him back in 30 minutes.

## 2024-01-04 NOTE — Telephone Encounter (Signed)
 I called patient and left a detailed message that appointment was changed to 03/18/24 at420pm

## 2024-02-12 DIAGNOSIS — M868X7 Other osteomyelitis, ankle and foot: Secondary | ICD-10-CM | POA: Diagnosis not present

## 2024-02-12 DIAGNOSIS — E1169 Type 2 diabetes mellitus with other specified complication: Secondary | ICD-10-CM | POA: Diagnosis not present

## 2024-02-15 HISTORY — PX: TOE SURGERY: SHX1073

## 2024-02-17 DIAGNOSIS — M869 Osteomyelitis, unspecified: Secondary | ICD-10-CM | POA: Diagnosis not present

## 2024-02-17 DIAGNOSIS — I739 Peripheral vascular disease, unspecified: Secondary | ICD-10-CM | POA: Diagnosis not present

## 2024-02-19 DIAGNOSIS — I70222 Atherosclerosis of native arteries of extremities with rest pain, left leg: Secondary | ICD-10-CM | POA: Diagnosis not present

## 2024-02-19 DIAGNOSIS — Z789 Other specified health status: Secondary | ICD-10-CM | POA: Diagnosis not present

## 2024-02-19 DIAGNOSIS — Z7409 Other reduced mobility: Secondary | ICD-10-CM | POA: Diagnosis not present

## 2024-02-19 DIAGNOSIS — M869 Osteomyelitis, unspecified: Secondary | ICD-10-CM | POA: Diagnosis not present

## 2024-03-02 ENCOUNTER — Encounter: Payer: Self-pay | Admitting: Nurse Practitioner

## 2024-03-02 ENCOUNTER — Telehealth: Payer: Self-pay

## 2024-03-02 ENCOUNTER — Ambulatory Visit (INDEPENDENT_AMBULATORY_CARE_PROVIDER_SITE_OTHER): Admitting: Nurse Practitioner

## 2024-03-02 VITALS — BP 114/70 | HR 84 | Temp 97.4°F | Ht 67.0 in | Wt 126.8 lb

## 2024-03-02 DIAGNOSIS — Z89419 Acquired absence of unspecified great toe: Secondary | ICD-10-CM | POA: Insufficient documentation

## 2024-03-02 DIAGNOSIS — R0982 Postnasal drip: Secondary | ICD-10-CM

## 2024-03-02 DIAGNOSIS — D649 Anemia, unspecified: Secondary | ICD-10-CM

## 2024-03-02 DIAGNOSIS — Z8673 Personal history of transient ischemic attack (TIA), and cerebral infarction without residual deficits: Secondary | ICD-10-CM

## 2024-03-02 DIAGNOSIS — F320 Major depressive disorder, single episode, mild: Secondary | ICD-10-CM

## 2024-03-02 DIAGNOSIS — R200 Anesthesia of skin: Secondary | ICD-10-CM

## 2024-03-02 DIAGNOSIS — Z7984 Long term (current) use of oral hypoglycemic drugs: Secondary | ICD-10-CM

## 2024-03-02 DIAGNOSIS — E538 Deficiency of other specified B group vitamins: Secondary | ICD-10-CM

## 2024-03-02 DIAGNOSIS — E119 Type 2 diabetes mellitus without complications: Secondary | ICD-10-CM

## 2024-03-02 DIAGNOSIS — I739 Peripheral vascular disease, unspecified: Secondary | ICD-10-CM | POA: Insufficient documentation

## 2024-03-02 MED ORDER — OXYCODONE HCL 5 MG PO CAPS
5.0000 mg | ORAL_CAPSULE | Freq: Four times a day (QID) | ORAL | 0 refills | Status: DC | PRN
Start: 2024-03-02 — End: 2024-06-02

## 2024-03-02 MED ORDER — GABAPENTIN 300 MG PO CAPS
300.0000 mg | ORAL_CAPSULE | Freq: Every day | ORAL | 0 refills | Status: DC
Start: 2024-03-02 — End: 2024-05-06

## 2024-03-02 MED ORDER — LORATADINE 10 MG PO TABS: 10.0000 mg | ORAL_TABLET | Freq: Every day | ORAL | 11 refills | Status: DC

## 2024-03-02 NOTE — Assessment & Plan Note (Signed)
 Chronic, stable. His last A1c in the hospital was 8.1%. Continue metformin  XR 500mg  BID. His blood glucose levels are below 200 mg/dL, which is acceptable.

## 2024-03-02 NOTE — Assessment & Plan Note (Signed)
 Recommend he continue vitamin B12 supplement daily. May need injection if not improving.

## 2024-03-02 NOTE — Transitions of Care (Post Inpatient/ED Visit) (Signed)
   03/02/2024  Name: Gary Gay MRN: [id number] DOB: 14-Oct-1956  Today's TOC FU Call Status: Today's TOC FU Call Status:: Successful TOC FU Call Completed TOC FU Call Complete Date: 03/02/24 Patient's Name and Date of Birth confirmed.  Transition Care Management Follow-up Telephone Call Date of Discharge: 02/15/24 Discharge Facility:  [facility] [place]) Type of Discharge: Inpatient Admission Primary Inpatient Discharge Diagnosis:: Sepsis Left foot-great toe How have you been since you were released from the hospital?: Better Any questions or concerns?: No  Items Reviewed: Did you receive and understand the discharge instructions provided?: Yes Medications obtained,verified, and reconciled?: Yes (Medications Reviewed) Any new allergies since your discharge?: No Dietary orders reviewed?: NA Do you have support at home?: Yes People in Home [RPT]: sibling(s)  Medications Reviewed Today: Medications Reviewed Today     Reviewed by Cherylann Corpus, Winfred Hausen, CMA (Certified Medical Assistant) on 03/02/24 at 1114  Med List Status: <None>   Medication Order Taking? Sig Documenting Provider Last Dose Status Informant  aspirin  EC 81 MG tablet [id number] Yes  [provider] Taking Active   atorvastatin  (LIPITOR) 80 MG tablet [id number] No Take 1 tablet (80 mg total) by mouth daily.  Patient not taking: Reported on 03/02/2024   Odette Benjamin, NP Not Taking Active   cyanocobalamin  (VITAMIN B12) 1000 MCG tablet [id number] Yes Take 1 tablet by mouth daily. [provider] Taking Active   Iron , Ferrous Sulfate , 325 (65 Fe) MG TABS [id number] Yes Take 325 mg by mouth daily. McElwee, Lauren A, NP Taking Active   losartan -hydrochlorothiazide  (HYZAAR) 50-12.5 MG tablet [id number] Yes Take 1 tablet by mouth daily. McElwee, Lauren A, NP Taking Active   metFORMIN  (GLUCOPHAGE -XR) 500 MG 24 hr tablet [id number] No TAKE 2 TABLETS(1000 MG) BY MOUTH DAILY WITH BREAKFAST   Patient not taking: Reported on 03/02/2024   Odette Benjamin, NP Not Taking Active   omeprazole (PRILOSEC) 20 MG capsule [id number] No Take 20 mg by mouth daily.  Patient not taking: Reported on 03/02/2024   [provider] Not Taking Active             Home Care and Equipment/Supplies: Were Home Health Services Ordered?: No Any new equipment or medical supplies ordered?: Yes Name of Medical supply agency?: purchased can from [facility] Were you able to get the equipment/medical supplies?: Yes Do you have any questions related to the use of the equipment/supplies?: No  Functional Questionnaire: Do you need assistance with bathing/showering or dressing?: No Do you need assistance with meal preparation?: No Do you need assistance with eating?: No Do you have difficulty maintaining continence: No Do you need assistance with getting out of bed/getting out of a chair/moving?: No Do you have difficulty managing or taking your medications?: No  Follow up appointments reviewed: PCP Follow-up appointment confirmed?: Yes Date of PCP follow-up appointment?: 03/02/24 Follow-up Provider: Rheba Cedar, NP [facility] Follow-up appointment confirmed?: NA Do you need transportation to your follow-up appointment?: Yes Transportation Need Intervention Addressed By:: Provider Office Notified Do you understand care options if your condition(s) worsen?: Yes-patient verbalized understanding    SIGNATURE: BMS/CMA

## 2024-03-02 NOTE — Patient Instructions (Addendum)
 It was great to see you!  You have an appointment with the surgeon Dr. Rochelle Chu tomorrow at 9:20 am Sherolyn Dixon, DPM Podiatry [address] [place] [address]  You have an appointment with vascular on 03/17/24 at 8:45am Adams, Unice Gant, PA-C Physician Assistant [address] [place] [address]  Start oxycodone 1 tablet every 6 hours as needed for pain  Start gabapentin  1 capsule at bedtime   Let's follow-up in 3 months, sooner if you have concerns.  If a referral was placed today, you will be contacted for an appointment. Please note that routine referrals can sometimes take up to 3-4 weeks to process. Please call our office if you haven't heard anything after this time frame.  Take care,  Rheba Cedar, NP

## 2024-03-02 NOTE — Assessment & Plan Note (Signed)
 He had recent femoral endarterectomy, which is healing well. Follow up with the vascular surgeon on Mar 17, 2024, at 8:45 AM. Continue atorvastatin  80mg  daily and aspirin  81mg  daily. Recommend complete tobacco cessation.

## 2024-03-02 NOTE — Assessment & Plan Note (Signed)
 Continue metformin XR 500mg  BID

## 2024-03-02 NOTE — Assessment & Plan Note (Signed)
 He is having ongoing iron  deficiency. He was re-started on iron  supplement in hospital and encourage him to continue this daily. If levels do not improve, will refer to hematology for possible iron  infusion.

## 2024-03-02 NOTE — Assessment & Plan Note (Signed)
 He recently underwent a toe amputation, which is healing well, but he is experiencing phantom pain. Follow up with Dr. Rochelle Chu on Mar 03, 2024, at 9:20 AM for incision evaluation and potential stitch removal. Refill oxycodone 5mg  every 6 hours as needed for pain management. PDMP reviewed. Start gabapentin  300mg  at bedtime as well.

## 2024-03-02 NOTE — Progress Notes (Signed)
 Established Patient Office Visit  Subjective   Patient ID: Gary Gay, male    DOB: [birth year]  Age: 68 y.o. MRN: [id number]  Chief Complaint  Patient presents with   Hospitalization Follow-up    Went on 02/11/24-Infection in great toe on left foot    HPI Discussed the use of AI scribe software for clinical note transcription with the patient, who gave verbal consent to proceed.  History of Present Illness   Gary Gay is a 68 year old male with a recent left great toe amputation and peripheral artery disease who presents for follow-up after hospitalization.  He underwent a toe amputation due to an infection following trauma. He was diagnosed with osteomyelitis and was in the hospital from 02/11/24-02/19/24. He experiences phantom pain at the amputation site, occasionally disrupting sleep. Post-hospitalization, he reports no significant pain otherwise. He has a follow-up appointment with podiatry tomorrow.   During hospitalization, plaque was removed from his left femoral artery in his leg, and the area is healing well. He denies chest pain or shortness of breath. Blood sugar levels remain stable, not exceeding 200 mg/dL. He has an appointment with vascular on 03/17/24.   He has started vitamin B12 and iron  supplements due to previously low levels. He lives with his cousin, a recovering addict, and desires his own living space.  He struggles with dietary habits, experiencing fluctuations in eating patterns and weight loss. He prefers red grapes and occasionally consumes vegetables like carrots and broccoli.   He experiences a sensation of drainage in his throat, sometimes feeling obstructed, especially when excited. He denies shortness of breath and cough. He has not tried anything for this before.    He is still having right arm numbness after his stroke. He has tried duloxetine , gabapentin , amitriptyline  in the past with minimal relief. He states that the gabapentin  did help some and  would like to try this again.        ROS See pertinent positives and negatives per HPI.    Objective:     BP 114/70 (BP Location: Left Arm, Patient Position: Sitting, Cuff Size: Small)   Pulse 84   Temp (!) 97.4 F (36.3 C)   Ht 5\' 7"  (1.702 m)   Wt 126 lb 12.8 oz (57.5 kg)   SpO2 100%   BMI 19.86 kg/m    Physical Exam Vitals and nursing note reviewed.  Constitutional:      Appearance: Normal appearance.  HENT:     Head: Normocephalic.  Eyes:     Conjunctiva/sclera: Conjunctivae normal.  Cardiovascular:     Rate and Rhythm: Normal rate and regular rhythm.     Pulses: Normal pulses.     Heart sounds: Normal heart sounds.  Pulmonary:     Effort: Pulmonary effort is normal.     Breath sounds: Normal breath sounds.  Musculoskeletal:     Cervical back: Normal range of motion.  Skin:    General: Skin is warm.     Comments: Left foot in post op shoe, incisions covered  Neurological:     General: No focal deficit present.     Mental Status: He is alert and oriented to person, place, and time.  Psychiatric:        Mood and Affect: Mood normal.        Behavior: Behavior normal.        Thought Content: Thought content normal.        Judgment: Judgment normal.      Assessment &  Plan:   Problem List Items Addressed This Visit       Cardiovascular and Mediastinum   PAD (peripheral artery disease) (HCC) - Primary   He had recent femoral endarterectomy, which is healing well. Follow up with the vascular surgeon on Mar 17, 2024, at 8:45 AM. Continue atorvastatin  80mg  daily and aspirin  81mg  daily. Recommend complete tobacco cessation.         Endocrine   Diabetes mellitus without complication (HCC)   Chronic, stable. His last A1c in the hospital was 8.1%. Continue metformin  XR 500mg  BID. His blood glucose levels are below 200 mg/dL, which is acceptable.        Other   History of CVA (cerebrovascular accident)   He has a history of CVA.  Continue aspirin  81 mg  daily and atorvastatin  80mg  daily. LDL well controlled.       Anemia   He is having ongoing iron  deficiency. He was re-started on iron  supplement in hospital and encourage him to continue this daily. If levels do not improve, will refer to hematology for possible iron  infusion.       Numbness   He has persistent numbness in his right arm post-stroke. Start gabapentin  300mg  at bedtime for numbness management. He states that this did help some in the past.       Depression, major, single episode, mild (HCC)   Chronic, ongoing. He denies SI/HI. He is not interested in medication at this point. He was doing therapy through the [place].       Vitamin B12 deficiency   Recommend he continue vitamin B12 supplement daily. May need injection if not improving.       Long term current use of oral hypoglycemic drug   Continue metformin  XR 500mg  BID.      History of amputation of great toe (HCC)   He recently underwent a toe amputation, which is healing well, but he is experiencing phantom pain. Follow up with Dr. Rochelle Chu on Mar 03, 2024, at 9:20 AM for incision evaluation and potential stitch removal. Refill oxycodone 5mg  every 6 hours as needed for pain management. PDMP reviewed. Start gabapentin  300mg  at bedtime as well.       Other Visit Diagnoses       Postnasal drip       He experiences drainage causing swallowing difficulty and a sensation of blocked breathing. Start Claritin 10mg  once daily to manage drainage.       Return in about 3 months (around 06/02/2024) for Diabetes.    Odette Benjamin, NP

## 2024-03-02 NOTE — Assessment & Plan Note (Signed)
 He has persistent numbness in his right arm post-stroke. Start gabapentin  300mg  at bedtime for numbness management. He states that this did help some in the past.

## 2024-03-02 NOTE — Assessment & Plan Note (Signed)
 He has a history of CVA.  Continue aspirin  81 mg daily and atorvastatin  80mg  daily. LDL well controlled.

## 2024-03-02 NOTE — Assessment & Plan Note (Signed)
 Chronic, ongoing. He denies SI/HI. He is not interested in medication at this point. He was doing therapy through the [place].

## 2024-03-09 ENCOUNTER — Ambulatory Visit: Payer: Self-pay

## 2024-03-09 NOTE — Telephone Encounter (Signed)
 I called [facility] and spoke with the pharmacist and she said that patient had come to the pharmacy and she told him that she even looked back on their camera and a lady probably family member picked up his Rx of Oxycodone  yesterday. She believes the patient is confused.   I called patient and left a message for him to return call.  Lauren aware and for future refills patient will need to contact his surgeon.

## 2024-03-09 NOTE — Telephone Encounter (Signed)
 Copied from CRM [id number]. Topic: General - Other >> Mar 09, 2024  1:12 PM Abigail D wrote: Reason for CRM: Patient returning call from [place] per oxycodone . Please call back when available.

## 2024-03-09 NOTE — Telephone Encounter (Signed)
 Copied from CRM [id number]. Topic: Clinical - Prescription Issue >> Mar 09, 2024  8:48 AM Clydene Darner H wrote: Reason for CRM: Patient called stating he received the incorrect prescription refill for Claritin . He reported that this issue has been ongoing for the past five days. He is requesting the correct prescription refill for Oxycodone  instead. >> Mar 09, 2024  2:44 PM Magdalene School wrote: Patient calling back stating that he still has not received a call back regarding this issue. >> Mar 09, 2024 10:50 AM Allyne Areola wrote: Patient is calling to follow up on this request. The pharmacy informed him that he picked up the prescription yesterday but per patient he did not pick up the original prescription.

## 2024-03-09 NOTE — Telephone Encounter (Signed)
 1st attempt, called and left a voicemail for patient to call back for nurse triage.  Copied from CRM [id number]. Topic: Clinical - Red Word Triage >> Mar 09, 2024  1:31 PM Gary Gay D wrote: Red Word that prompted transfer to Nurse Triage: Patient returning CMA call regarding recent rx issue CRM. Told patient the CMA was rooming a patient but per CAL she would be calling him back after. I asked if there was anything else I could help him with today and he stated "Yes, if you could find me a machine gun to take the pain away."

## 2024-03-09 NOTE — Telephone Encounter (Signed)
 I returned patient's call and notified him that I have spoke with [facility] pharmacy and that Rx was dispensed and picked up yesterday by his niece. Shawn denies that he received Rx by his niece and that he received Claritin  yesterday.   I called [facility] pharmacy again and spoke with pharmacy tech and she said that Rx was on hold and Oxycodone  had to be ordered and was dispensed and patient's niece picked Rx up yesterday and everything is documented on their camera.  I called patient back and notified him of message and he was not pleased and wanted his Rx of Oxycodone . I told patient per Barbra Ley that he needs to consult with his surgeon that removed his toe for a Rx refill.

## 2024-03-09 NOTE — Telephone Encounter (Signed)
 Noted. Addressed below messages with patient and Rheba Cedar, NP.

## 2024-03-09 NOTE — Telephone Encounter (Signed)
 Copied from CRM [id number]. Topic: Clinical - Prescription Issue >> Mar 09, 2024  8:48 AM Clydene Darner H wrote: Reason for CRM: Patient called stating he received the incorrect prescription refill for Claritin . He reported that this issue has been ongoing for the past five days. He is requesting the correct prescription refill for Oxycodone  instead. >> Mar 09, 2024 10:50 AM Allyne Areola wrote: Patient is calling to follow up on this request. The pharmacy informed him that he picked up the prescription yesterday but per patient he did not pick up the original prescription.

## 2024-03-18 ENCOUNTER — Ambulatory Visit (INDEPENDENT_AMBULATORY_CARE_PROVIDER_SITE_OTHER)

## 2024-03-18 DIAGNOSIS — Z0000 Encounter for general adult medical examination without abnormal findings: Secondary | ICD-10-CM | POA: Diagnosis not present

## 2024-03-18 DIAGNOSIS — Z1211 Encounter for screening for malignant neoplasm of colon: Secondary | ICD-10-CM

## 2024-03-18 NOTE — Patient Instructions (Signed)
 Mr. Gary Gay , Thank you for taking time out of your busy schedule to complete your Annual Wellness Visit with me. I enjoyed our conversation and look forward to speaking with you again next year. I, as well as your care team,  appreciate your ongoing commitment to your health goals. Please review the following plan we discussed and let me know if I can assist you in the future. Your Game plan/ To Do List    Referrals: If you haven't heard from the office you've been referred to, please reach out to them at the phone provided.  N/a Follow up Visits: Next Medicare AWV with our clinical staff: 03/24/2025 at 4:20   Have you seen your provider in the last 6 months (3 months if uncontrolled diabetes)? Yes Next Office Visit with your provider: 06/02/2024 at 10:00  Clinician Recommendations:  Aim for 30 minutes of exercise or brisk walking, 6-8 glasses of water, and 5 servings of fruits and vegetables each day.       This is a list of the screening recommended for you and due dates:  Health Maintenance  Topic Date Due   Eye exam for diabetics  Never done   Zoster (Shingles) Vaccine (1 of 2) Never done   Cologuard (Stool DNA test)  01/17/2023   Hemoglobin A1C  02/04/2024   Yearly kidney function blood test for diabetes  04/14/2024   Yearly kidney health urinalysis for diabetes  04/14/2024   Complete foot exam   05/05/2024   Flu Shot  05/27/2024   Medicare Annual Wellness Visit  03/18/2025   DTaP/Tdap/Td vaccine (2 - Td or Tdap) 04/09/2025   Hepatitis C Screening  Completed   HPV Vaccine  Aged Out   Meningitis B Vaccine  Aged Out   Pneumonia Vaccine  Discontinued   COVID-19 Vaccine  Discontinued    Advanced directives: (ACP Link)Information on Advanced Care Planning can be found at [place]  Secretary of State Advance Health Care Directives Advance Health Care Directives. [email or web address]  Advance Care Planning is important because it:  [x]  Makes sure you receive the medical care that is  consistent with your values, goals, and preferences  [x]  It provides guidance to your family and loved ones and reduces their decisional burden about whether or not they are making the right decisions based on your wishes.  Follow the link provided in your after visit summary or read over the paperwork we have mailed to you to help you started getting your Advance Directives in place. If you need assistance in completing these, please reach out to us  so that we can help you!  See attachments for Preventive Care and Fall Prevention Tips.

## 2024-03-18 NOTE — Progress Notes (Signed)
 Subjective:   Gary Gay is a 68 y.o. who presents for a Medicare Wellness preventive visit.  As a reminder, Annual Wellness Visits don't include a physical exam, and some assessments may be limited, especially if this visit is performed virtually. We may recommend an in-person follow-up visit with your provider if needed.  Visit Complete: Virtual I connected with  Gary Gay on 03/18/24 by a audio enabled telemedicine application and verified that I am speaking with the correct person using two identifiers.  Patient Location: Home  Provider Location: Office/Clinic  I discussed the limitations of evaluation and management by telemedicine. The patient expressed understanding and agreed to proceed.  Vital Signs: Because this visit was a virtual/telehealth visit, some criteria may be missing or patient reported. Any vitals not documented were not able to be obtained and vitals that have been documented are patient reported.  VideoError- Librarian, academic were attempted between this provider and patient, however failed, due to patient having technical difficulties OR patient did not have access to video capability.  We continued and completed visit with audio only.   Persons Participating in Visit: Patient.  AWV Questionnaire: No: Patient Medicare AWV questionnaire was not completed prior to this visit.  Cardiac Risk Factors include: advanced age (>92men, >1 women);diabetes mellitus;dyslipidemia;hypertension;male gender     Objective:     Today's Vitals   03/18/24 1621  PainSc: 8    There is no height or weight on file to calculate BMI.     03/18/2024    4:36 PM 02/16/2023    8:40 AM 12/01/2022    8:32 AM 08/13/2022    9:09 AM 03/05/2022   12:33 PM 02/25/2022   11:24 AM 02/24/2022    5:12 PM  Advanced Directives  Does Patient Have a Medical Advance Directive? No Yes Yes No No  No  Type of Advance Directive  Living will Healthcare Power of  [place];Living will      Copy of Healthcare Power of Attorney in Chart?   No - copy requested      Would patient like information on creating a medical advance directive? No - Patient declined     No - Patient declined     Current Medications (verified) Outpatient Encounter Medications as of 03/18/2024  Medication Sig   aspirin  EC 81 MG tablet    cyanocobalamin  (VITAMIN B12) 1000 MCG tablet Take 1 tablet by mouth daily.   gabapentin  (NEURONTIN ) 300 MG capsule Take 1 capsule (300 mg total) by mouth at bedtime.   Iron , Ferrous Sulfate , 325 (65 Fe) MG TABS Take 325 mg by mouth daily.   loratadine  (CLARITIN ) 10 MG tablet Take 1 tablet (10 mg total) by mouth daily.   losartan -hydrochlorothiazide  (HYZAAR) 50-12.5 MG tablet Take 1 tablet by mouth daily.   metFORMIN  (GLUCOPHAGE -XR) 500 MG 24 hr tablet TAKE 2 TABLETS(1000 MG) BY MOUTH DAILY WITH BREAKFAST (Patient taking differently: Takes every now and then)   atorvastatin  (LIPITOR) 80 MG tablet Take 1 tablet (80 mg total) by mouth daily. (Patient not taking: Reported on 03/18/2024)   omeprazole (PRILOSEC) 20 MG capsule Take 20 mg by mouth daily. (Patient not taking: Reported on 03/18/2024)   oxycodone  (OXY-IR) 5 MG capsule Take 1 capsule (5 mg total) by mouth every 6 (six) hours as needed. (Patient not taking: Reported on 03/18/2024)   No facility-administered encounter medications on file as of 03/18/2024.    Allergies (verified) Patient has no known allergies.   History: Past Medical History:  Diagnosis Date   Diabetes mellitus    Hypertension    Stroke [facility])    Past Surgical History:  Procedure Laterality Date   SHOULDER SURGERY Right    TOE SURGERY Left 02/15/2024   great toe   Family History  Problem Relation Age of Onset   Cancer Sister        pancreatic   Stroke Brother    Diabetes Maternal Grandmother    Social History   Socioeconomic History   Marital status: Divorced    Spouse name: Not on file   Number of children:  Not on file   Years of education: Not on file   Highest education level: Not on file  Occupational History   Occupation: bus driver  Tobacco Use   Smoking status: Some Days    Current packs/day: 0.25    Average packs/day: 0.3 packs/day for 49.0 years (12.3 ttl pk-yrs)    Types: Cigarettes   Smokeless tobacco: Never  Vaping Use   Vaping status: Never Used  Substance and Sexual Activity   Alcohol use: Not Currently    Comment: social use remotely   Drug use: No   Sexual activity: Not on file  Other Topics Concern   Not on file  Social History Narrative   Not on file   Social Drivers of Health   Financial Resource Strain: Low Risk  (03/18/2024)   Overall Financial Resource Strain (CARDIA)    Difficulty of Paying Living Expenses: Not hard at all  Food Insecurity: No Food Insecurity (03/18/2024)   Hunger Vital Sign    Worried About Running Out of Food in the Last Year: Never true    Ran Out of Food in the Last Year: Never true  Recent Concern: Food Insecurity - Medium Risk (02/12/2024)   Received from [facility]   Hunger Vital Sign    Worried About Running Out of Food in the Last Year: Sometimes true    Ran Out of Food in the Last Year: Sometimes true  Transportation Needs: No Transportation Needs (03/18/2024)   PRAPARE - Administrator, Civil Service (Medical): No    Lack of Transportation (Non-Medical): No  Physical Activity: Inactive (03/18/2024)   Exercise Vital Sign    Days of Exercise per Week: 0 days    Minutes of Exercise per Session: 0 min  Stress: Stress Concern Present (03/18/2024)   [employer] of Occupational Health - Occupational Stress Questionnaire    Feeling of Stress : To some extent  Social Connections: Socially Isolated (03/18/2024)   Social Connection and Isolation Panel [NHANES]    Frequency of Communication with Friends and Family: More than three times a week    Frequency of Social Gatherings with Friends and Family: More than three  times a week    Attends Religious Services: Never    Database administrator or Organizations: No    Attends Engineer, structural: Never    Marital Status: Divorced    Tobacco Counseling Ready to quit: Not Answered Counseling given: Not Answered    Clinical Intake:  Pre-visit preparation completed: Yes  Pain : 0-10 Pain Score: 8  Pain Type: Other (Comment) (surgical pain) Pain Location: Toe (Comment which one) Pain Orientation: Left Pain Descriptors / Indicators: Aching Pain Onset: 1 to 4 weeks ago Pain Frequency: Constant     Nutritional Risks: None Diabetes: Yes CBG done?: No Did pt. bring in CBG monitor from home?: No  Lab Results  Component Value Date  HGBA1C 7.2 (A) 08/06/2023   HGBA1C 7.2 08/06/2023   HGBA1C 7.2 (A) 08/06/2023   HGBA1C 7.2 (A) 08/06/2023     How often do you need to have someone help you when you read instructions, pamphlets, or other written materials from your doctor or pharmacy?: 1 - Never  Interpreter Needed?: No  Information entered by :: NAllen LPN   Activities of Daily Living     03/18/2024    4:24 PM  In your present state of health, do you have any difficulty performing the following activities:  Hearing? 0  Vision? 0  Difficulty concentrating or making decisions? 0  Walking or climbing stairs? 0  Dressing or bathing? 0  Doing errands, shopping? 0  Preparing Food and eating ? N  Using the Toilet? N  In the past six months, have you accidently leaked urine? N  Do you have problems with loss of bowel control? N  Managing your Medications? N  Managing your Finances? N  Housekeeping or managing your Housekeeping? N    Patient Care Team: Odette Benjamin, NP as PCP - General (Internal Medicine)  Indicate any recent Medical Services you may have received from other than [facility] providers in the past year (date may be approximate).     Assessment:    This is a routine wellness examination for  Gary Gay.  Hearing/Vision screen Hearing Screening - Comments:: Denies hearing issues Vision Screening - Comments:: No regular eye exams   Goals Addressed             This Visit's Progress    Patient Stated       03/18/2024, wants to get healed from surgery       Depression Screen     03/18/2024    4:38 PM 03/02/2024   12:02 PM 08/06/2023    9:22 AM 04/15/2023    9:04 AM 01/01/2023   11:44 AM 12/01/2022    8:33 AM 09/04/2022    9:05 AM  PHQ 2/9 Scores  PHQ - 2 Score  4 0 3 1 0 0  PHQ- 9 Score  18 3 10 6     Exception Documentation Patient refusal          Fall Risk     03/18/2024    4:37 PM 03/02/2024   12:02 PM 08/06/2023    8:46 AM 04/15/2023    9:04 AM 02/16/2023    8:40 AM  Fall Risk   Falls in the past year? 0 0 0 0 0  Number falls in past yr: 0 0 0 0 0  Injury with Fall? 0 0 0 0 0  Risk for fall due to : Medication side effect No Fall Risks No Fall Risks No Fall Risks   Follow up Falls evaluation completed;Falls prevention discussed Falls evaluation completed Falls evaluation completed Falls evaluation completed Falls evaluation completed    MEDICARE RISK AT HOME:  Medicare Risk at Home Any stairs in or around the home?: No If so, are there any without handrails?: No Home free of loose throw rugs in walkways, pet beds, electrical cords, etc?: Yes Adequate lighting in your home to reduce risk of falls?: Yes Life alert?: No Use of a cane, walker or w/c?: No Grab bars in the bathroom?: No Shower chair or bench in shower?: No Elevated toilet seat or a handicapped toilet?: Yes  TIMED UP AND GO:  Was the test performed?  No  Cognitive Function: 6CIT completed        03/18/2024  4:39 PM 12/01/2022    8:33 AM  6CIT Screen  What Year? 0 points 0 points  What month? 0 points 0 points  What time? 0 points 0 points  Count back from 20 0 points 2 points  Months in reverse 2 points 2 points  Repeat phrase 4 points 0 points  Total Score 6 points 4 points     Immunizations Immunization History  Administered Date(s) Administered   Fluad Quad(high Dose 65+) 07/11/2022   Influenza,inj,Quad PF,6+ Mos 08/11/2016   Influenza,inj,quad, With Preservative 12/16/2018   Influenza-Unspecified 07/24/2015   Moderna Sars-Covid-2 Vaccination 01/27/2020, 02/24/2020   Pneumococcal Polysaccharide-23 04/10/2015   Tdap 04/10/2015    Screening Tests Health Maintenance  Topic Date Due   OPHTHALMOLOGY EXAM  Never done   Zoster Vaccines- Shingrix (1 of 2) Never done   Fecal DNA (Cologuard)  01/17/2023   HEMOGLOBIN A1C  02/04/2024   Diabetic kidney evaluation - eGFR measurement  04/14/2024   Diabetic kidney evaluation - Urine ACR  04/14/2024   FOOT EXAM  05/05/2024   INFLUENZA VACCINE  05/27/2024   Medicare Annual Wellness (AWV)  03/18/2025   DTaP/Tdap/Td (2 - Td or Tdap) 04/09/2025   Hepatitis C Screening  Completed   HPV VACCINES  Aged Out   Meningococcal B Vaccine  Aged Out   Pneumonia Vaccine 47+ Years old  Discontinued   COVID-19 Vaccine  Discontinued    Health Maintenance  Health Maintenance Due  Topic Date Due   OPHTHALMOLOGY EXAM  Never done   Zoster Vaccines- Shingrix (1 of 2) Never done   Fecal DNA (Cologuard)  01/17/2023   HEMOGLOBIN A1C  02/04/2024   Diabetic kidney evaluation - eGFR measurement  04/14/2024   Diabetic kidney evaluation - Urine ACR  04/14/2024   Health Maintenance Items Addressed: Declines shingles vaccine. [facility] eye exam at [facility]. Cologuard ordered  Additional Screening:  Vision Screening: Recommended annual ophthalmology exams for early detection of glaucoma and other disorders of the eye.  Dental Screening: Recommended annual dental exams for proper oral hygiene  Community Resource Referral / Chronic Care Management: CRR required this visit?  No   CCM required this visit?  No   Plan:    I have personally reviewed and noted the following in the patient's chart:   Medical and social history Use of  alcohol, tobacco or illicit drugs  Current medications and supplements including opioid prescriptions. Patient is currently taking opioid prescriptions. Information provided to patient regarding non-opioid alternatives. Patient advised to discuss non-opioid treatment plan with their provider. Functional ability and status Nutritional status Physical activity Advanced directives List of other physicians Hospitalizations, surgeries, and ER visits in previous 12 months Vitals Screenings to include cognitive, depression, and falls Referrals and appointments  In addition, I have reviewed and discussed with patient certain preventive protocols, quality metrics, and best practice recommendations. A written personalized care plan for preventive services as well as general preventive health recommendations were provided to patient.   Areatha Beecham, LPN   [id number]   After Visit Summary: (Pick Up) Due to this being a telephonic visit, with patients personalized plan was offered to patient and patient has requested to Pick up at office.  Notes: Nothing significant to report at this time.

## 2024-03-22 ENCOUNTER — Telehealth: Payer: Self-pay | Admitting: *Deleted

## 2024-03-22 NOTE — Progress Notes (Signed)
 Complex Care Management Note Care Guide Note  03/22/2024 Name: Kionte Baumgardner MRN: [id number] DOB: 28-Mar-1956   Complex Care Management Outreach Attempts: An unsuccessful telephone outreach was attempted today to offer the patient information about available complex care management services.  Follow Up Plan:  Additional outreach attempts will be made to offer the patient complex care management information and services.   Encounter Outcome:  No Answer  [name] HealthPopulation Health Care Guide  Direct Dial:[phone] Fax:[phone] Website: [email or web address]

## 2024-03-24 ENCOUNTER — Telehealth: Payer: Self-pay | Admitting: *Deleted

## 2024-03-24 IMAGING — MR MR LUMBAR SPINE W/O CM
4 of 5 series · 26 of 48 positions shown · non-contrast
Comparison: Lumbar spine radiographs 06/09/2012

CLINICAL DATA: Lumbar myelopathy.  Left leg numbness and pain.

EXAM:
MRI LUMBAR SPINE WITHOUT CONTRAST
TECHNIQUE: Multiplanar, multisequence MR imaging of the lumbar spine was
performed. No intravenous contrast was administered.

[Series 5: T2 · sagittal · 4.0mm · 0.73mm/px · 6 of 16 slices shown (1 of 2)]
[im 1/16]
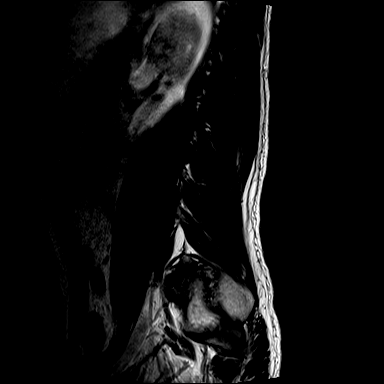
[im 4/16]
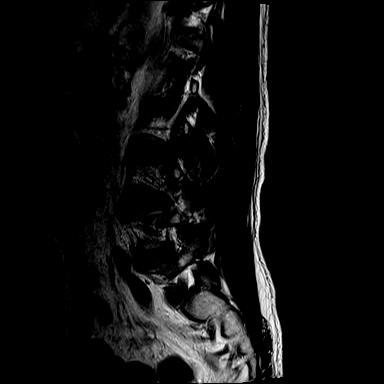
[im 7/16]
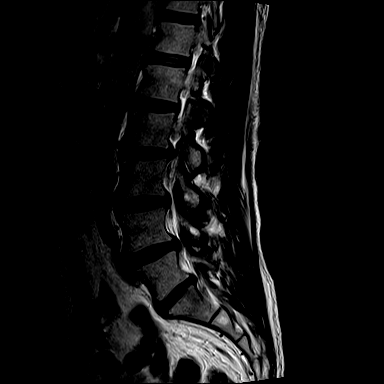
[im 10/16]
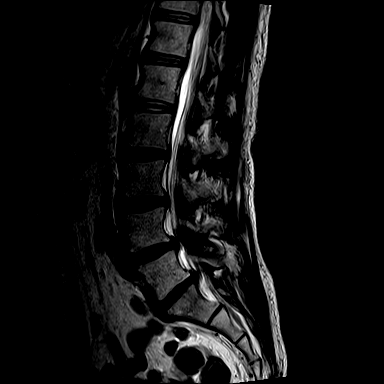
[im 13/16]
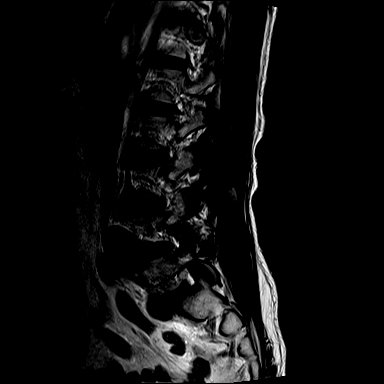
[im 16/16]
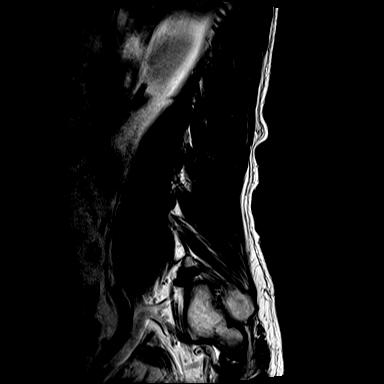

[Series 7: T1 · sagittal · 4.0mm · 0.88mm/px · 6 of 16 slices shown (1 of 2)]
[im 1/16]
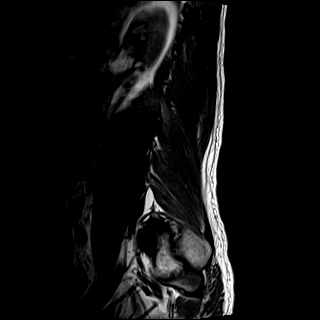
[im 4/16]
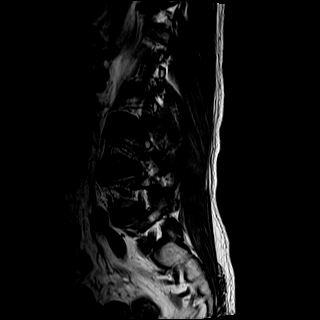
[im 7/16]
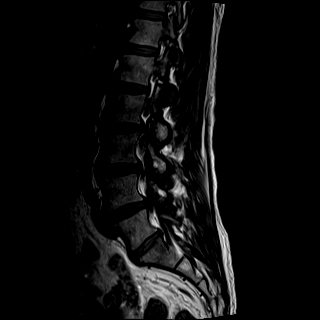
[im 10/16]
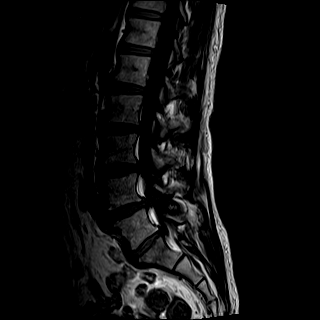
[im 13/16]
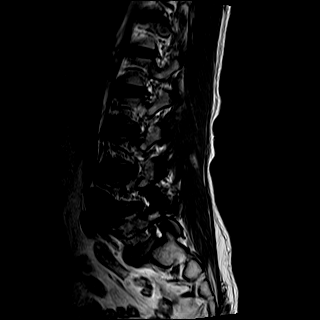
[im 16/16]
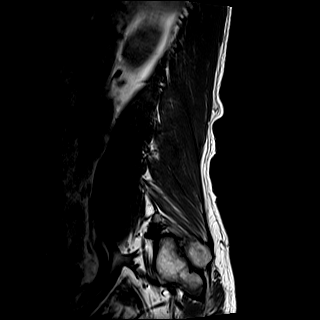

[Series 8: T2 · axial · 4.0mm · 0.57mm/px · z∈[-143,+77]mm · 9 of 37 slices shown (2 of 2)]
[im 1/37]
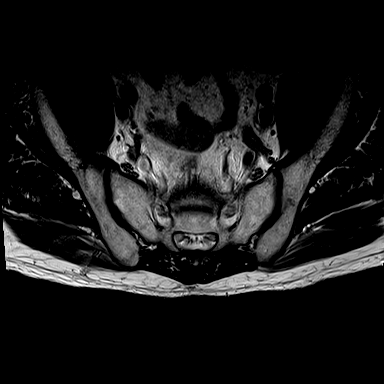
[im 6/37]
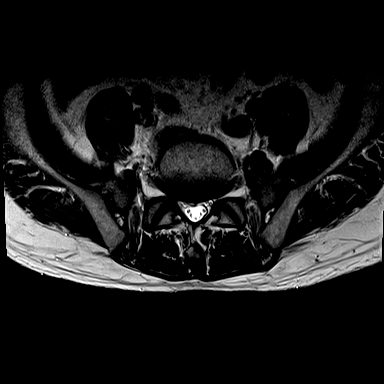
[im 11/37]
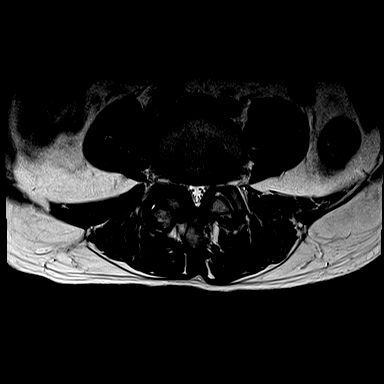
[im 16/37]
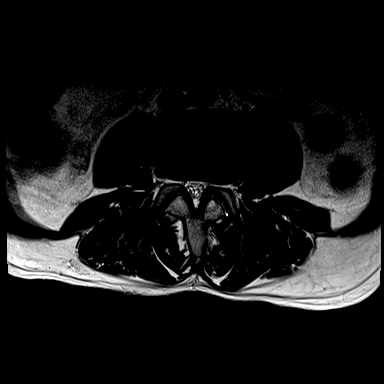
[im 19/37]
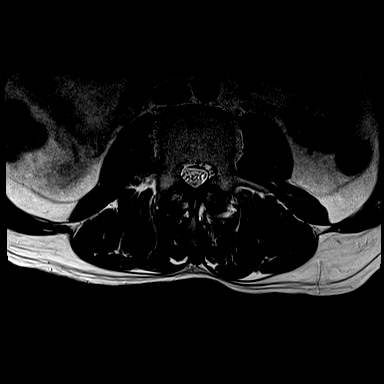
[im 21/37]
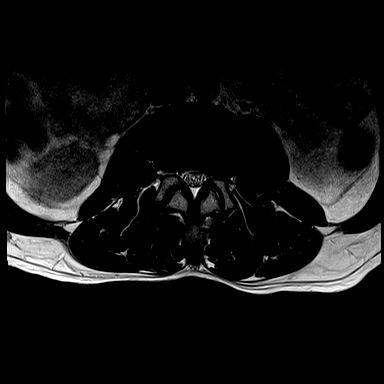
[im 26/37]
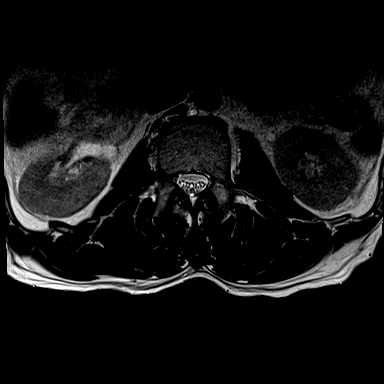
[im 31/37]
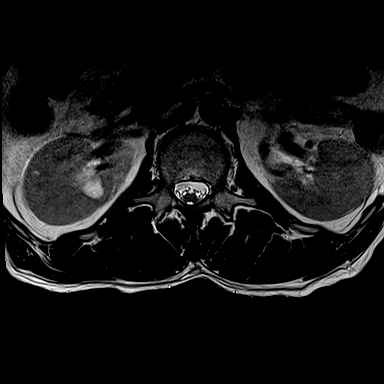
[im 37/37]
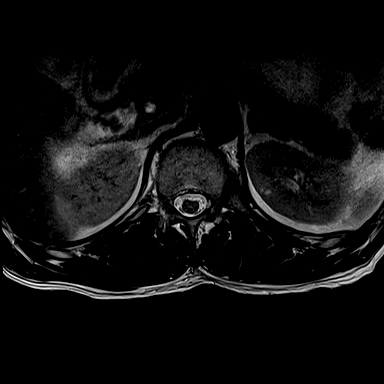

[Series 9: T1 · axial · 4.0mm · 0.34mm/px · z∈[-143,+47]mm · 5 of 37 slices shown (2 of 2)]
[im 1/37]
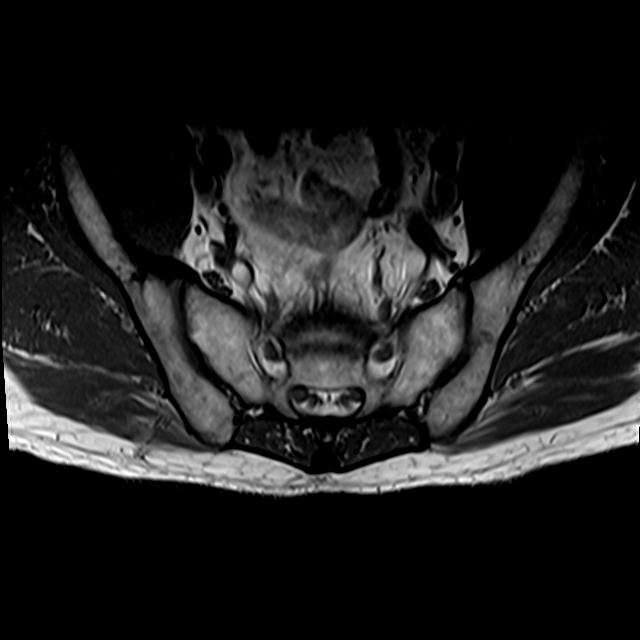
[im 6/37]
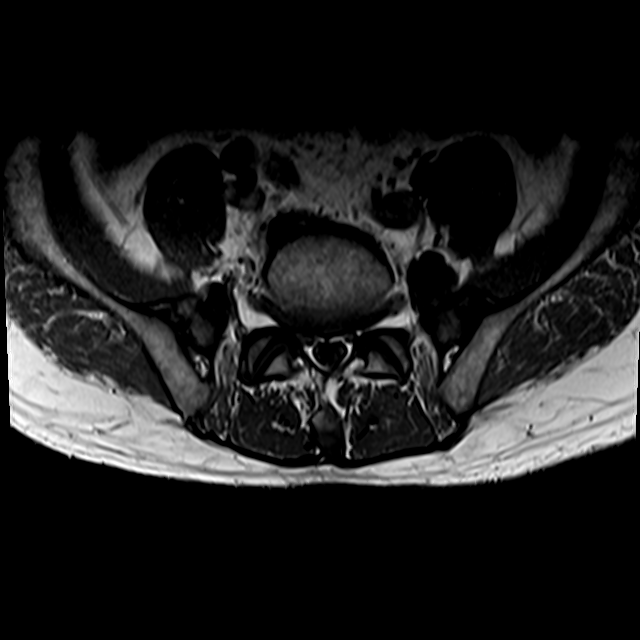
[im 11/37]
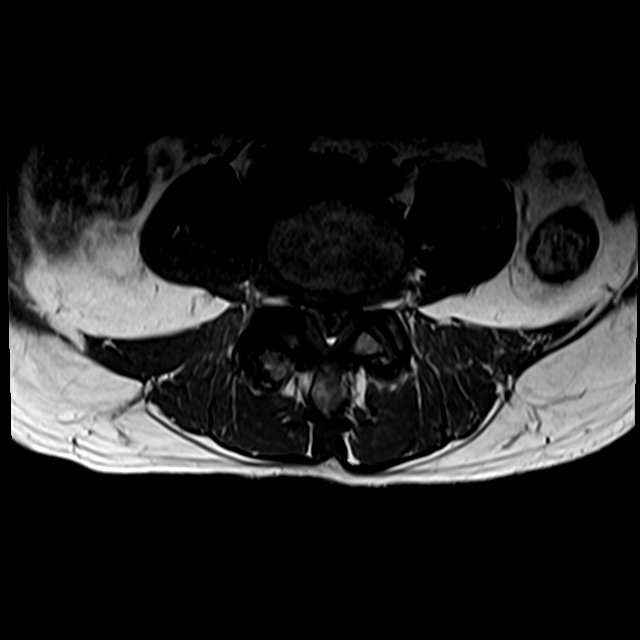
[im 19/37]
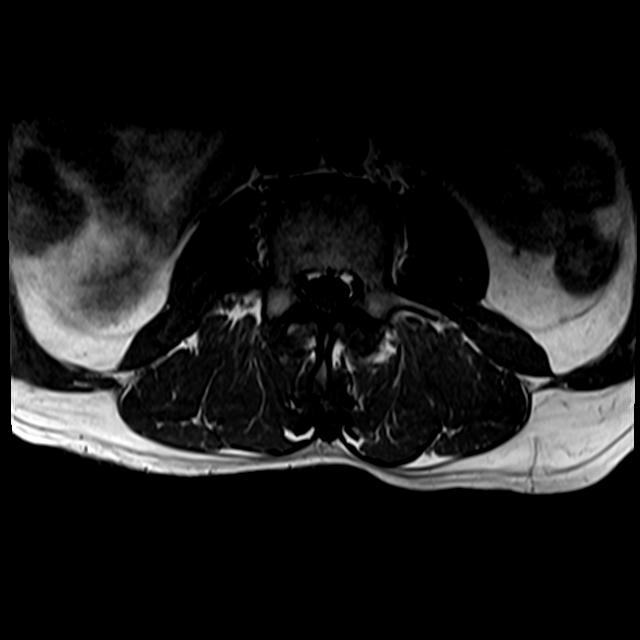
[im 31/37]
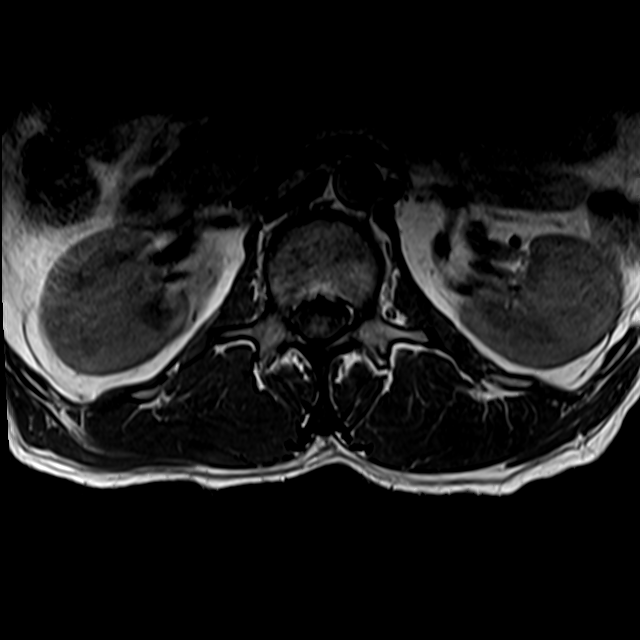

[26 of 48 positions shown; findings below may reference images not displayed]

FINDINGS: Segmentation:  Standard.

Alignment:  Normal.

Vertebrae: No fracture, suspicious marrow lesion, or significant
marrow edema.

Conus medullaris and cauda equina: Conus extends to the L1-2 level.
Conus and cauda equina appear normal.

Paraspinal and other soft tissues: 2 cm right renal cyst for which
no follow-up imaging is recommended.

Disc levels:

Disc desiccation throughout the lumbar spine. Mild disc space
narrowing at L4-5 and L5-S1. Diffuse congenital narrowing of the
lumbar spinal canal due to short pedicles.

T12-L1: Negative.

L1-2: Mild disc bulging and mild facet hypertrophy without
significant stenosis.

L2-3: Circumferential disc bulging, a left foraminal disc
protrusion, and mild facet hypertrophy result in mild spinal
stenosis, mild left greater than right lateral recess stenosis, and
mild left neural foraminal stenosis.

L3-4: Disc bulging and moderate facet and ligamentum flavum
hypertrophy result in mild bilateral lateral recess stenosis and
mild left greater than right neural foraminal stenosis without
significant spinal stenosis.

L4-5: Disc bulging and severe right greater than left facet and
ligamentum flavum hypertrophy result in mild spinal stenosis,
moderate bilateral lateral recess stenosis, and moderate bilateral
neural foraminal stenosis.

L5-S1: Disc bulging, a small right paracentral disc extrusion with
slight cephalad migration, and mild facet hypertrophy result in
moderate right and mild left lateral recess stenosis, borderline
spinal stenosis, and mild bilateral neural foraminal stenosis.
IMPRESSION: 1. Congenitally short pedicles with superimposed disc and facet
degeneration.
2. Moderate lateral recess and neural foraminal stenosis at L4-5.
3. Moderate right lateral recess stenosis at L5-S1 due to a disc
extrusion.

## 2024-03-24 NOTE — Progress Notes (Signed)
 Complex Care Management Note Care Guide Note  03/24/2024 Name: Gary Gay MRN: [id number] DOB: Feb 29, 1956   Complex Care Management Outreach Attempts: An unsuccessful telephone outreach was attempted today to offer the patient information about available complex care management services.  Follow Up Plan:  Additional outreach attempts will be made to offer the patient complex care management information and services.   Encounter Outcome:  No Answer  [name] HealthPopulation Health Care Guide  Direct Dial:[phone] Fax:[phone] Website: [email or web address]

## 2024-03-28 ENCOUNTER — Telehealth: Payer: Self-pay | Admitting: *Deleted

## 2024-03-28 NOTE — Progress Notes (Signed)
 Complex Care Management Note Care Guide Note  03/28/2024 Name: Gary Gay MRN: [id number] DOB: [birth year]   Complex Care Management Outreach Attempts: A third unsuccessful outreach was attempted today to offer the patient with information about available complex care management services.  Follow Up Plan:  No further outreach attempts will be made at this time. We have been unable to contact the patient to offer or enroll patient in complex care management services.  Encounter Outcome:  No Answer  [name] HealthPopulation Health Care Guide  Direct Dial:[phone] [phone] Website: [email or web address]

## 2024-04-22 ENCOUNTER — Encounter

## 2024-04-28 DIAGNOSIS — Z89412 Acquired absence of left great toe: Secondary | ICD-10-CM | POA: Diagnosis not present

## 2024-04-28 DIAGNOSIS — E1165 Type 2 diabetes mellitus with hyperglycemia: Secondary | ICD-10-CM | POA: Diagnosis not present

## 2024-04-28 DIAGNOSIS — E1142 Type 2 diabetes mellitus with diabetic polyneuropathy: Secondary | ICD-10-CM | POA: Diagnosis not present

## 2024-04-28 DIAGNOSIS — I739 Peripheral vascular disease, unspecified: Secondary | ICD-10-CM | POA: Diagnosis not present

## 2024-05-06 ENCOUNTER — Ambulatory Visit: Payer: Self-pay

## 2024-05-06 ENCOUNTER — Telehealth: Payer: Self-pay

## 2024-05-06 ENCOUNTER — Other Ambulatory Visit: Payer: Self-pay | Admitting: Nurse Practitioner

## 2024-05-06 DIAGNOSIS — Z1211 Encounter for screening for malignant neoplasm of colon: Secondary | ICD-10-CM

## 2024-05-06 MED ORDER — GABAPENTIN 300 MG PO CAPS: 300.0000 mg | ORAL_CAPSULE | Freq: Every day | ORAL | 0 refills | Status: DC

## 2024-05-06 NOTE — Addendum Note (Signed)
 Addended by: Fujie Dickison A on: 05/06/2024 03:51 PM   Modules accepted: Orders

## 2024-05-06 NOTE — Telephone Encounter (Signed)
 Last Ov and fill 03/02/24

## 2024-05-06 NOTE — Telephone Encounter (Signed)
 Copied from CRM [id number]. Topic: Clinical - Medication Refill >> May 06, 2024  1:30 PM Chiquita SQUIBB wrote: Medication: gabapentin  gabapentin  (NEURONTIN ) 300 MG capsule   Has the patient contacted their pharmacy? Yes (Agent: If no, request that the patient contact the pharmacy for the refill. If patient does not wish to contact the pharmacy document the reason why and proceed with request.) (Agent: If yes, when and what did the pharmacy advise?)  This is the patient's preferred pharmacy:  [id number], [place] - [address] AT [facility] [address] [place] [address] Phone: [phone] Fax: [phone]  Is this the correct pharmacy for this prescription? Yes If no, delete pharmacy and type the correct one.   Has the prescription been filled recently? No  Is the patient out of the medication? No- Patient has two left  Has the patient been seen for an appointment in the last year OR does the patient have an upcoming appointment? Yes  Can we respond through MyChart? No  Agent: Please be advised that Rx refills may take up to 3 business days. We ask that you follow-up with your pharmacy.

## 2024-05-06 NOTE — Telephone Encounter (Signed)
 Forwarding message   Copied from CRM [id number]. Topic: General - Other >> May 06, 2024  1:29 PM Chiquita SQUIBB wrote: Reason for CRM: Patient is calling in regarding a text message that he received a text stating to return his colorguard. Patient states he never received it at all. Patient is asking if this can be sent to him again.

## 2024-05-06 NOTE — Telephone Encounter (Signed)
 FYI Only or Action Required?: FYI only for provider.  Patient was last seen in primary care on 03/02/2024 by Nedra Tinnie LABOR, NP.  Called Nurse Triage reporting Fatigue.  Symptoms began several weeks ago.  Interventions attempted: Rest, hydration, or home remedies.  Symptoms are: unchanged.  Triage Disposition: See PCP When Office is Open (Within 3 Days)  Patient/caregiver understands and will follow disposition?: Yes  Copied from CRM [id number]. Topic: Clinical - Red Word Triage >> May 06, 2024  1:33 PM Chiquita SQUIBB wrote: Red Word that prompted transfer to Nurse Triage: Patient stated he has been having pain in his right side and it is getting worse. Reason for Disposition  [1] MILD weakness (e.g., does not interfere with ability to work, go to school, normal activities) AND [2] persists > 1 week  Answer Assessment - Initial Assessment Questions 1. DESCRIPTION: Describe how you are feeling.     Patient reports continued weakness to the right side of body and decreased mobility 2. SEVERITY: How bad is it?  Can you stand and walk?     Mild 3. ONSET: When did these symptoms begin? (e.g., hours, days, weeks, months)     Six months ago 4. CAUSE: What do you think is causing the weakness or fatigue? (e.g., not drinking enough fluids, medical problem, trouble sleeping)     unsure 5. NEW MEDICINES:  Have you started on any new medicines recently? (e.g., opioid pain medicines, benzodiazepines, muscle relaxants, antidepressants, antihistamines, neuroleptics, beta blockers)     no 6. OTHER SYMPTOMS: Do you have any other symptoms? (e.g., chest pain, fever, cough, SOB, vomiting, diarrhea, bleeding, other areas of pain)     Tingling in his hands  Patient states that gabapentin  isn't working-patient states he did increase his gabapentin  from 300 to 600 mg. Patient states the increase is making him tired. Patient states he feels like his weakness is not improving the way he feel like it  should.   Patient is scheduled for an appointment for 05/10/2024 9:20 AM-patient scheduled at this time due to needing time to request transportation.  Protocols used: Weakness (Generalized) and Fatigue-A-AH

## 2024-05-06 NOTE — Addendum Note (Signed)
 Addended by: Jalynn Waddell A on: 05/06/2024 03:40 PM   Modules accepted: Orders

## 2024-05-09 NOTE — Telephone Encounter (Signed)
 Patient's address was confirmed and fixed in computer.

## 2024-05-10 ENCOUNTER — Encounter: Payer: Self-pay | Admitting: Family Medicine

## 2024-05-10 ENCOUNTER — Telehealth: Payer: Self-pay

## 2024-05-10 ENCOUNTER — Ambulatory Visit (INDEPENDENT_AMBULATORY_CARE_PROVIDER_SITE_OTHER): Admitting: Family Medicine

## 2024-05-10 VITALS — BP 114/70 | HR 88 | Temp 97.5°F | Ht 67.0 in | Wt 128.4 lb

## 2024-05-10 DIAGNOSIS — Z8673 Personal history of transient ischemic attack (TIA), and cerebral infarction without residual deficits: Secondary | ICD-10-CM | POA: Diagnosis not present

## 2024-05-10 NOTE — Progress Notes (Signed)
 Established Patient Office Visit   Subjective:  Patient ID: Gary Gay, male    DOB: 1956/07/27  Age: 68 y.o. MRN: [id number]  Chief Complaint  Patient presents with   weakness    Weakness, soreness on right side had stroke 2 yrs ago.    HPI Encounter Diagnoses  Name Primary?   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  He was unable to attend physical therapy due to lack of transportation.  He came to the clinic today by [name].  Recently started on gabapentin  to see if that could help.  He does not feel as though it has been helpful.  He recently discovered that the [place] can help him with transportation.   Review of Systems  Constitutional: Negative.   HENT: Negative.    Eyes:  Negative for blurred vision, discharge and redness.  Respiratory: Negative.    Cardiovascular: Negative.   Gastrointestinal:  Negative for abdominal pain.  Genitourinary: Negative.   Musculoskeletal: Negative.  Negative for myalgias.  Skin:  Negative for rash.  Neurological:  Positive for tingling and focal weakness. Negative for loss of consciousness and weakness.  Endo/Heme/Allergies:  Negative for polydipsia.     Current Outpatient Medications:    aspirin  EC 81 MG tablet, , Disp: , Rfl:    cyanocobalamin  (VITAMIN B12) 1000 MCG tablet, Take 1 tablet by mouth daily., Disp: , Rfl:    gabapentin  (NEURONTIN ) 300 MG capsule, Take 1 capsule (300 mg total) by mouth at bedtime., Disp: 90 capsule, Rfl: 0   Iron , Ferrous Sulfate , 325 (65 Fe) MG TABS, Take 325 mg by mouth daily., Disp: 30 tablet, Rfl: 2   loratadine  (CLARITIN ) 10 MG tablet, Take 1 tablet (10 mg total) by mouth daily., Disp: 30 tablet, Rfl: 11   atorvastatin  (LIPITOR) 80 MG tablet, Take 1 tablet (80 mg total) by mouth daily. (Patient not taking: Reported on 03/18/2024), Disp: 90 tablet, Rfl: 3    losartan -hydrochlorothiazide  (HYZAAR) 50-12.5 MG tablet, Take 1 tablet by mouth daily. (Patient not taking: Reported on 05/10/2024), Disp: 90 tablet, Rfl: 3   metFORMIN  (GLUCOPHAGE -XR) 500 MG 24 hr tablet, TAKE 2 TABLETS(1000 MG) BY MOUTH DAILY WITH BREAKFAST (Patient taking differently: Takes every now and then), Disp: 180 tablet, Rfl: 1   omeprazole (PRILOSEC) 20 MG capsule, Take 20 mg by mouth daily. (Patient not taking: Reported on 03/18/2024), Disp: , Rfl:    oxycodone  (OXY-IR) 5 MG capsule, Take 1 capsule (5 mg total) by mouth every 6 (six) hours as needed. (Patient not taking: Reported on 03/18/2024), Disp: 10 capsule, Rfl: 0   Objective:     BP 114/70 (BP Location: Right Arm, Patient Position: Sitting, Cuff Size: Normal)   Pulse 88   Temp (!) 97.5 F (36.4 C) (Temporal)   Ht 5' 7 (1.702 m)   Wt 128 lb 6.4 oz (58.2 kg)   SpO2 100%   BMI 20.11 kg/m    Physical Exam Constitutional:      General: He is not in acute distress.    Appearance: Normal appearance. He is not ill-appearing, toxic-appearing or diaphoretic.  HENT:     Head: Normocephalic and atraumatic.     Right Ear: External ear normal.     Left Ear: External ear normal.  Eyes:     General: No scleral icterus.       Right eye: No discharge.  Left eye: No discharge.     Extraocular Movements: Extraocular movements intact.     Conjunctiva/sclera: Conjunctivae normal.  Pulmonary:     Effort: Pulmonary effort is normal. No respiratory distress.  Musculoskeletal:     Right shoulder: Normal range of motion. Normal strength.     Left shoulder: Normal range of motion. Normal strength.     Right hand: Normal strength.     Left hand: Normal strength.     Right hip: Normal strength.     Left hip: Normal strength.     Comments: Grip strength was normal.  Resists flexion and extension through the elbow normally.  Resisted abduction of upper arms is normal.  Stands on heels and toes without issue.  Squats normally.   Abduction and adduction through thighs is normal.     Left Lower Extremity: (Left great toe) Skin:    General: Skin is warm and dry.  Neurological:     Mental Status: He is alert and oriented to person, place, and time.  Psychiatric:        Mood and Affect: Mood normal.        Behavior: Behavior normal.      No results found for any visits on 05/10/24.    The ASCVD Risk score (Arnett DK, et al., 2019) failed to calculate for the following reasons:   Risk score cannot be calculated because patient has a medical history suggesting prior/existing ASCVD    Assessment & Plan:   History of CVA (cerebrovascular accident) -     AMB Referral [facility] Care Management    Return Follow-up with Lauren as planned in early August..  Explained that the paresthesias from his stroke are likely permanent.  Strength is relatively good at this point but could benefit from physical therapy.  Social services consult for help with transportation needs.  Elsie Sim Lent, MD

## 2024-05-10 NOTE — Telephone Encounter (Signed)
 Patient came by office and requested a handicap placard and wanted information regarding colonoscopy.  Placard filled out and given to patient notified patient that a cologuard would be mailed to him.

## 2024-05-12 ENCOUNTER — Telehealth: Payer: Self-pay

## 2024-05-12 NOTE — Progress Notes (Signed)
 Complex Care Management Note Care Guide Note  05/12/2024 Name: Gary Gay MRN: [id number] DOB: Jan 20, 1956   Complex Care Management Outreach Attempts: An unsuccessful telephone outreach was attempted today to offer the patient information about available complex care management services.  Follow Up Plan:  Additional outreach attempts will be made to offer the patient complex care management information and services.   Encounter Outcome:  No Answer  Leotis Rase [facility], [facility] Guide  Direct Dial: [phone]  Fax [phone]

## 2024-05-13 NOTE — Progress Notes (Signed)
 Complex Care Management Note Care Guide Note  05/13/2024 Name: Ontario Pettengill MRN: [id number] DOB: Nov 29, 1955   Complex Care Management Outreach Attempts: A second unsuccessful outreach was attempted today to offer the patient with information about available complex care management services.  Follow Up Plan:  Additional outreach attempts will be made to offer the patient complex care management information and services.   Encounter Outcome:  No Answer  Leotis Rase [facility], [facility] Guide  Direct Dial: [phone]  Fax [phone]

## 2024-05-13 NOTE — Telephone Encounter (Signed)
 I called and spoke with patient and notified him that I did not call him but that I see a message that [facility] with Complex Care Management has tried to reach him and not successful. Patient would like for her to call him again and if no answer to please leave a detailed message.

## 2024-05-13 NOTE — Telephone Encounter (Signed)
 Copied from CRM [id number]. Topic: General - Other >> May 13, 2024 11:42 AM Carlyon D wrote: Reason for CRM: Pt calling back in regards to a missed call from brittney and would like a call back

## 2024-05-13 NOTE — Progress Notes (Signed)
 Complex Care Management Note Care Guide Note  05/13/2024 Name: Gary Gay MRN: [id number] DOB: Nov 29, 1955   Complex Care Management Outreach Attempts: A second unsuccessful outreach was attempted today to offer the patient with information about available complex care management services.  Follow Up Plan:  Additional outreach attempts will be made to offer the patient complex care management information and services.   Encounter Outcome:  No Answer  Leotis Rase [facility], [facility] Guide  Direct Dial: [phone]  Fax [phone]

## 2024-05-13 NOTE — Telephone Encounter (Signed)
 NOTED. Message sent to [employer]

## 2024-05-16 ENCOUNTER — Telehealth: Payer: Self-pay

## 2024-05-16 NOTE — Progress Notes (Signed)
 Complex Care Management Note  Care Guide Note 05/16/2024 Name: Jese Comella MRN: [id number] DOB: Aug 21, 1956  Curren Mohrmann is a 68 y.o. year old male who sees McElwee, Lauren A, NP for primary care. I reached out to Caleel Dorminey by phone today to offer complex care management services.  Mr. Kegg was given information about Complex Care Management services today including:   The Complex Care Management services include support from the care team which includes your Nurse Care Manager, Clinical Social Worker, or Pharmacist.  The Complex Care Management team is here to help remove barriers to the health concerns and goals most important to you. Complex Care Management services are voluntary, and the patient may decline or stop services at any time by request to their care team member.   Complex Care Management Consent Status: Patient agreed to services and verbal consent obtained.   Follow up plan:  Telephone appointment with complex care management team member scheduled for:  05/20/24 @ 10:00 AM.  Encounter Outcome:  Patient Scheduled  Leotis Rase [facility], [facility] Guide  Direct Dial: [phone]  Fax [phone]

## 2024-05-16 NOTE — Progress Notes (Signed)
   Telephone encounter was:  Unsuccessful.  05/16/2024 Name: Gary Gay MRN: [id number] DOB: [birth year]  Unsuccessful outbound call made today to assist with:  Transportation Needs   Outreach Attempt:  1st Attempt No answer and unable to leave a message    [name] [facility]  [facility] Guide, Phone: [phone] Fax: [phone] Website: [email or web address]

## 2024-05-17 ENCOUNTER — Telehealth: Payer: Self-pay

## 2024-05-17 NOTE — Progress Notes (Signed)
   Telephone encounter was:  Unsuccessful.  05/17/2024 Name: Fabio Wah MRN: [id number] DOB: 09-24-1956  Unsuccessful outbound call made today to assist with:  Transportation Needs   Outreach Attempt:  1st Attempt No answer and unable to leave a message    [name] [facility]  [facility] Guide, Phone: [phone] Fax: [phone] Website: [email or web address]

## 2024-05-17 NOTE — Telephone Encounter (Signed)
 Noted

## 2024-05-18 ENCOUNTER — Telehealth: Payer: Self-pay

## 2024-05-18 NOTE — Progress Notes (Signed)
   Telephone encounter was:  Unsuccessful.  05/18/2024 Name: Gary Gay MRN: [id number] DOB: [birth year]  Unsuccessful outbound call made today to assist with:  Transportation Needs   Outreach Attempt:  3rd Attempt.  Referral closed unable to contact patient.  No answer and unable to leave a message    [name] [facility] Guide, Phone: [phone] Fax: [phone] Website: [email or web address]

## 2024-05-20 ENCOUNTER — Other Ambulatory Visit: Payer: Self-pay

## 2024-05-20 ENCOUNTER — Telehealth: Payer: Self-pay

## 2024-05-20 NOTE — Patient Outreach (Signed)
 RNCM Telephone Encounter Note  Called patient for initial visit.  Patient was annoyed as he stated he was told that I would be providing him transportation resources.  I explained to him that several attempts had been made by other staff to contact him for this reason, but were not able to reach him.  He states no one had called, he had not gotten any voicemails.  I explained my role, and he declined to continue with CCM, but did agree to still connect with BSW/Care Guides.  I spoke to [name], who had previously attempted outreach, she agreed to try once again to assist this patient.   Olam Idol BSN RN CCM Kimberly  [facility], [facility] RN Care Manager Direct Dial: [phone] Fax: [phone]

## 2024-06-02 ENCOUNTER — Encounter: Payer: Self-pay | Admitting: Nurse Practitioner

## 2024-06-02 ENCOUNTER — Ambulatory Visit: Payer: Self-pay | Admitting: Nurse Practitioner

## 2024-06-02 ENCOUNTER — Ambulatory Visit: Admitting: Nurse Practitioner

## 2024-06-02 VITALS — BP 112/72 | HR 93 | Temp 97.2°F | Ht 67.0 in | Wt 119.8 lb

## 2024-06-02 DIAGNOSIS — D649 Anemia, unspecified: Secondary | ICD-10-CM

## 2024-06-02 DIAGNOSIS — E538 Deficiency of other specified B group vitamins: Secondary | ICD-10-CM

## 2024-06-02 DIAGNOSIS — E785 Hyperlipidemia, unspecified: Secondary | ICD-10-CM | POA: Diagnosis not present

## 2024-06-02 DIAGNOSIS — Z5900 Homelessness unspecified: Secondary | ICD-10-CM | POA: Diagnosis not present

## 2024-06-02 DIAGNOSIS — R200 Anesthesia of skin: Secondary | ICD-10-CM | POA: Diagnosis not present

## 2024-06-02 DIAGNOSIS — R531 Weakness: Secondary | ICD-10-CM

## 2024-06-02 DIAGNOSIS — Z8673 Personal history of transient ischemic attack (TIA), and cerebral infarction without residual deficits: Secondary | ICD-10-CM | POA: Diagnosis not present

## 2024-06-02 DIAGNOSIS — I10 Essential (primary) hypertension: Secondary | ICD-10-CM | POA: Diagnosis not present

## 2024-06-02 DIAGNOSIS — E119 Type 2 diabetes mellitus without complications: Secondary | ICD-10-CM

## 2024-06-02 DIAGNOSIS — Z7984 Long term (current) use of oral hypoglycemic drugs: Secondary | ICD-10-CM

## 2024-06-02 LAB — CBC WITH DIFFERENTIAL/PLATELET
Basophils Absolute: 0 K/uL (ref 0.0–0.1)
Basophils Relative: 0.7 % (ref 0.0–3.0)
Eosinophils Absolute: 0.1 K/uL (ref 0.0–0.7)
Eosinophils Relative: 2 % (ref 0.0–5.0)
HCT: 31 % — ABNORMAL LOW (ref 39.0–52.0)
Hemoglobin: 9.6 g/dL — ABNORMAL LOW (ref 13.0–17.0)
Lymphocytes Relative: 34.6 % (ref 12.0–46.0)
Lymphs Abs: 1.6 K/uL (ref 0.7–4.0)
MCHC: 30.9 g/dL (ref 30.0–36.0)
MCV: 72.5 fl — ABNORMAL LOW (ref 78.0–100.0)
Monocytes Absolute: 0.3 K/uL (ref 0.1–1.0)
Monocytes Relative: 6.4 % (ref 3.0–12.0)
Neutro Abs: 2.6 K/uL (ref 1.4–7.7)
Neutrophils Relative %: 56.3 % (ref 43.0–77.0)
Platelets: 559 K/uL — ABNORMAL HIGH (ref 150.0–400.0)
RBC: 4.28 Mil/uL (ref 4.22–5.81)
RDW: 22.2 % — ABNORMAL HIGH (ref 11.5–15.5)
WBC: 4.6 K/uL (ref 4.0–10.5)

## 2024-06-02 LAB — COMPREHENSIVE METABOLIC PANEL WITH GFR
ALT: 13 U/L (ref 0–53)
AST: 23 U/L (ref 0–37)
Albumin: 3.9 g/dL (ref 3.5–5.2)
Alkaline Phosphatase: 113 U/L (ref 39–117)
BUN: 13 mg/dL (ref 6–23)
CO2: 24 meq/L (ref 19–32)
Calcium: 9.7 mg/dL (ref 8.4–10.5)
Chloride: 97 meq/L (ref 96–112)
Creatinine, Ser: 1.13 mg/dL (ref 0.40–1.50)
GFR: 67.08 mL/min (ref >60.00)
Glucose, Bld: 263 mg/dL — ABNORMAL HIGH (ref 70–99)
Potassium: 4.1 meq/L (ref 3.5–5.1)
Sodium: 135 meq/L (ref 135–145)
Total Bilirubin: 0.3 mg/dL (ref 0.2–1.2)
Total Protein: 7.6 g/dL (ref 6.0–8.3)

## 2024-06-02 LAB — LIPID PANEL
Cholesterol: 136 mg/dL (ref 0–200)
HDL: 28.7 mg/dL — ABNORMAL LOW (ref >39.00)
LDL Cholesterol: 83 mg/dL (ref 0–99)
NonHDL: 106.93
Total CHOL/HDL Ratio: 5
Triglycerides: 121 mg/dL (ref 0.0–149.0)
VLDL: 24.2 mg/dL (ref 0.0–40.0)

## 2024-06-02 LAB — FERRITIN: Ferritin: 52.7 ng/mL (ref 22.0–322.0)

## 2024-06-02 LAB — HEMOGLOBIN A1C: Hgb A1c MFr Bld: 8.7 % — ABNORMAL HIGH (ref 4.6–6.5)

## 2024-06-02 LAB — IRON: Iron: 19 ug/dL — ABNORMAL LOW (ref 42–165)

## 2024-06-02 LAB — VITAMIN B12: Vitamin B-12: 461 pg/mL (ref 211–911)

## 2024-06-02 MED ORDER — LOSARTAN POTASSIUM-HCTZ 50-12.5 MG PO TABS: 1.0000 | ORAL_TABLET | Freq: Every day | ORAL | 3 refills | Status: DC

## 2024-06-02 MED ORDER — GABAPENTIN 300 MG PO CAPS: 600.0000 mg | ORAL_CAPSULE | Freq: Every day | ORAL | 1 refills | Status: DC

## 2024-06-02 MED ORDER — ATORVASTATIN CALCIUM 80 MG PO TABS: 80.0000 mg | ORAL_TABLET | Freq: Every day | ORAL | 3 refills | Status: DC

## 2024-06-02 MED ORDER — FLUTICASONE PROPIONATE 50 MCG/ACT NA SUSP
2.0000 | Freq: Every day | NASAL | 6 refills | Status: DC
Start: 2024-06-02 — End: 2024-08-24

## 2024-06-02 MED ORDER — METFORMIN HCL ER 500 MG PO TB24: 1000.0000 mg | ORAL_TABLET | Freq: Every day | ORAL | 1 refills | Status: DC

## 2024-06-02 NOTE — Addendum Note (Signed)
 Addended by: Hadeel Hillebrand A on: 06/02/2024 02:57 PM   Modules accepted: Level of Service

## 2024-06-02 NOTE — Assessment & Plan Note (Signed)
 Chronic, stable. Last A1c was 8.1%. Continue metformin  XR 500mg  BID. Check CMP, CBC, A1c, urine microalbumin today.

## 2024-06-02 NOTE — Patient Instructions (Signed)
 It was great to see you!  We are checking your labs today and will let you know the results via mychart/phone.   Keep taking your medications as prescribed  Increase your gabapentin  to 2 capsules at bedtime   Let's follow-up in 3 months, sooner if you have concerns.  If a referral was placed today, you will be contacted for an appointment. Please note that routine referrals can sometimes take up to 3-4 weeks to process. Please call our office if you haven't heard anything after this time frame.  Take care,  Tinnie Harada, NP

## 2024-06-02 NOTE — Assessment & Plan Note (Signed)
 Chronic, ongoing. Residual from prior stroke. Continue gabapentin  600mg  at bedtime.

## 2024-06-02 NOTE — Assessment & Plan Note (Signed)
 He has been experiencing generalized and especially right arm and leg weakness after his stroke. Will order home health PT and OT.

## 2024-06-02 NOTE — Assessment & Plan Note (Signed)
Continue atorvastatin 80 mg daily and we will check lipid panel today. 

## 2024-06-02 NOTE — Assessment & Plan Note (Signed)
 He is having ongoing iron  deficiency. He was re-started on iron  supplement in hospital and encourage him to continue this daily. Check CBC, iron , ferritin, B12 today.

## 2024-06-02 NOTE — Progress Notes (Signed)
 Established Patient Office Visit  Subjective   Patient ID: Gary Gay, male    DOB: [birth year]  Age: 68 y.o. MRN: [id number]  Chief Complaint  Patient presents with   Diabetes    Follow up, no concerns    HPI Discussed the use of AI scribe software for clinical note transcription with the patient, who gave verbal consent to proceed.  History of Present Illness   Gary Gay is a 68 year old male who presents with weight loss and diarrhea following a heat-related episode.  He experienced diarrhea and weakness last week, leading to a weight loss of nine pounds, from 128 to 119 pounds. The diarrhea resolved last night. He increased his gabapentin  dose to two pills at bedtime, which aids sleep but not pain. He describes his pain as tingling and with a sensation of being 'grabbed and won't let go.' He has drainage down the back of his throat since 1983, post-exposure to a gas chamber exercise, with no relief from Claritin  and no use of nasal sprays. He lives in an unsanitary environment with his cousin, who is undergoing crack withdrawal. He lacks transportation and has mobility challenges, with leg cramps when not exercising. He has not been taking his iron  pills. He denies chest pain, shortness of breath, or acid reflux. He avoids fast food due to high salt content.       ROS See pertinent positives and negatives per HPI.    Objective:     BP 112/72 (BP Location: Left Arm, Patient Position: Sitting, Cuff Size: Normal)   Pulse 93   Temp (!) 97.2 F (36.2 C)   Ht 5' 7 (1.702 m)   Wt 119 lb 12.8 oz (54.3 kg)   SpO2 100%   BMI 18.76 kg/m  BP Readings from Last 3 Encounters:  06/02/24 112/72  05/10/24 114/70  03/02/24 114/70   Wt Readings from Last 3 Encounters:  06/02/24 119 lb 12.8 oz (54.3 kg)  05/10/24 128 lb 6.4 oz (58.2 kg)  03/02/24 126 lb 12.8 oz (57.5 kg)      Physical Exam Vitals and nursing note reviewed.  Constitutional:      Appearance: He is  underweight.     Comments: Not well groomed, has body odor, 2 cockroaches fell off him during visit  HENT:     Head: Normocephalic.  Eyes:     Conjunctiva/sclera: Conjunctivae normal.  Cardiovascular:     Rate and Rhythm: Normal rate and regular rhythm.     Pulses: Normal pulses.     Heart sounds: Normal heart sounds.  Pulmonary:     Effort: Pulmonary effort is normal.     Breath sounds: Normal breath sounds.  Musculoskeletal:     Cervical back: Normal range of motion.  Skin:    General: Skin is warm.  Neurological:     General: No focal deficit present.     Mental Status: He is alert and oriented to person, place, and time.  Psychiatric:        Mood and Affect: Mood normal.        Behavior: Behavior normal.        Thought Content: Thought content normal.        Judgment: Judgment normal.       Assessment & Plan:   Problem List Items Addressed This Visit       Cardiovascular and Mediastinum   Essential hypertension - Primary (Chronic)   Chronic, stable. Continue losartan -hydrochlorothiazide  50-12.5mg  daily. Follow-up in 6 months.  Relevant Medications   atorvastatin  (LIPITOR) 80 MG tablet   losartan -hydrochlorothiazide  (HYZAAR) 50-12.5 MG tablet   Other Relevant Orders   CBC with Differential/Platelet   Comprehensive metabolic panel with GFR     Endocrine   Diabetes mellitus without complication (HCC)   Chronic, stable. Last A1c was 8.1%. Continue metformin  XR 500mg  BID. Check CMP, CBC, A1c, urine microalbumin today.       Relevant Medications   atorvastatin  (LIPITOR) 80 MG tablet   losartan -hydrochlorothiazide  (HYZAAR) 50-12.5 MG tablet   metFORMIN  (GLUCOPHAGE -XR) 500 MG 24 hr tablet   Other Relevant Orders   CBC with Differential/Platelet   Comprehensive metabolic panel with GFR   Microalbumin / creatinine urine ratio   Hemoglobin A1c     Other   Dyslipidemia (Chronic)   Continue atorvastatin  80 mg daily and we will check lipid panel today.       Relevant Medications   atorvastatin  (LIPITOR) 80 MG tablet   Other Relevant Orders   CBC with Differential/Platelet   Comprehensive metabolic panel with GFR   Lipid panel   History of CVA (cerebrovascular accident)   Relevant Orders   Ambulatory referral to Home Health   Anemia   He is having ongoing iron  deficiency. He was re-started on iron  supplement in hospital and encourage him to continue this daily. Check CBC, iron , ferritin, B12 today.       Relevant Orders   CBC with Differential/Platelet   Iron    Ferritin   Vitamin B12   Numbness   Chronic, ongoing. Residual from prior stroke. Continue gabapentin  600mg  at bedtime.       Homeless   He is currently living with his cousin in a run down house. He had 2 cockroaches fall off him during the visit and body odor. He declined [facility] and help from the [facility]. Discussed having meds packaged from [facility] and delivered, however he declined.       Vitamin B12 deficiency   He has not been taking b12 supplement, recommend he start this daily. Check B12 levels and treat based on results.       Relevant Orders   Vitamin B12   Long term current use of oral hypoglycemic drug   Continue metformin  XR 500mg  BID.      Weakness   He has been experiencing generalized and especially right arm and leg weakness after his stroke. Will order home health PT and OT.       Relevant Orders   Ambulatory referral to Home Health    Return in about 3 months (around 09/02/2024) for Diabetes.    Gary DELENA Harada, NP

## 2024-06-02 NOTE — Assessment & Plan Note (Signed)
Chronic, stable. Continue losartan-hydrochlorothiazide 50-12.5mg  daily. Follow-up in 6 months.

## 2024-06-02 NOTE — Assessment & Plan Note (Signed)
 He has not been taking b12 supplement, recommend he start this daily. Check B12 levels and treat based on results.

## 2024-06-02 NOTE — Assessment & Plan Note (Signed)
 Continue metformin XR 500mg  BID

## 2024-06-02 NOTE — Assessment & Plan Note (Signed)
 He is currently living with his cousin in a run down house. He had 2 cockroaches fall off him during the visit and body odor. He declined [facility] and help from the [facility]. Discussed having meds packaged from [facility] and delivered, however he declined.

## 2024-06-06 ENCOUNTER — Telehealth: Payer: Self-pay

## 2024-06-06 DIAGNOSIS — R531 Weakness: Secondary | ICD-10-CM | POA: Diagnosis not present

## 2024-06-06 DIAGNOSIS — E538 Deficiency of other specified B group vitamins: Secondary | ICD-10-CM | POA: Diagnosis not present

## 2024-06-06 DIAGNOSIS — I10 Essential (primary) hypertension: Secondary | ICD-10-CM | POA: Diagnosis not present

## 2024-06-06 DIAGNOSIS — D649 Anemia, unspecified: Secondary | ICD-10-CM | POA: Diagnosis not present

## 2024-06-06 DIAGNOSIS — E1151 Type 2 diabetes mellitus with diabetic peripheral angiopathy without gangrene: Secondary | ICD-10-CM | POA: Diagnosis not present

## 2024-06-06 NOTE — Telephone Encounter (Signed)
 Noted. We have offered him assistance through [facility] referrals on different occasions which he declined. He also declined housing assistance from [facility]. He can make his own medical decisions. See prior notes.

## 2024-06-06 NOTE — Telephone Encounter (Signed)
 Copied from CRM #[id number]. Topic: General - Other >> Jun 06, 2024  1:51 PM Thersia C wrote: Reason for CRM: Liji from [facility] called in regarding physical therapy stated patient is  Homeless and staying with his cousin, try start of care, but was unable to go inside . Called [facility] and waiting for them to give a callback

## 2024-06-10 ENCOUNTER — Telehealth: Payer: Self-pay

## 2024-06-10 MED ORDER — DICLOFENAC SODIUM 1 % EX GEL: 2.0000 g | Freq: Four times a day (QID) | CUTANEOUS | 1 refills | Status: AC

## 2024-06-10 NOTE — Telephone Encounter (Signed)
 Left patient a detailed message that Rx of Voltaren  Gel was sent to pharmacy.

## 2024-06-10 NOTE — Telephone Encounter (Signed)
 Copied from CRM #[id number]. Topic: Clinical - Medical Advice >> Jun 10, 2024  2:42 PM Rea ORN wrote: Reason for CRM: Pt is requesting a call back from [place]. He wanted to advise her that he went to the [facility] this morning and was diagnosed with having arthritis. He stated he would like PCP to order cream for his arthritis.

## 2024-06-22 DIAGNOSIS — E1151 Type 2 diabetes mellitus with diabetic peripheral angiopathy without gangrene: Secondary | ICD-10-CM | POA: Diagnosis not present

## 2024-06-22 DIAGNOSIS — F320 Major depressive disorder, single episode, mild: Secondary | ICD-10-CM | POA: Diagnosis not present

## 2024-06-22 DIAGNOSIS — Z7984 Long term (current) use of oral hypoglycemic drugs: Secondary | ICD-10-CM | POA: Diagnosis not present

## 2024-06-22 DIAGNOSIS — Z8673 Personal history of transient ischemic attack (TIA), and cerebral infarction without residual deficits: Secondary | ICD-10-CM | POA: Diagnosis not present

## 2024-06-22 DIAGNOSIS — Z5982 Transportation insecurity: Secondary | ICD-10-CM | POA: Diagnosis not present

## 2024-06-22 DIAGNOSIS — Z9181 History of falling: Secondary | ICD-10-CM | POA: Diagnosis not present

## 2024-06-22 DIAGNOSIS — E538 Deficiency of other specified B group vitamins: Secondary | ICD-10-CM | POA: Diagnosis not present

## 2024-06-22 DIAGNOSIS — F4310 Post-traumatic stress disorder, unspecified: Secondary | ICD-10-CM

## 2024-06-22 DIAGNOSIS — R531 Weakness: Secondary | ICD-10-CM | POA: Diagnosis not present

## 2024-06-22 DIAGNOSIS — I10 Essential (primary) hypertension: Secondary | ICD-10-CM | POA: Diagnosis not present

## 2024-06-22 DIAGNOSIS — Z5900 Homelessness unspecified: Secondary | ICD-10-CM | POA: Diagnosis not present

## 2024-06-22 DIAGNOSIS — D649 Anemia, unspecified: Secondary | ICD-10-CM | POA: Diagnosis not present

## 2024-08-02 DIAGNOSIS — E1165 Type 2 diabetes mellitus with hyperglycemia: Secondary | ICD-10-CM | POA: Diagnosis not present

## 2024-08-02 DIAGNOSIS — Z89412 Acquired absence of left great toe: Secondary | ICD-10-CM | POA: Diagnosis not present

## 2024-08-02 DIAGNOSIS — I739 Peripheral vascular disease, unspecified: Secondary | ICD-10-CM | POA: Diagnosis not present

## 2024-08-02 DIAGNOSIS — E1142 Type 2 diabetes mellitus with diabetic polyneuropathy: Secondary | ICD-10-CM | POA: Diagnosis not present

## 2024-08-04 ENCOUNTER — Ambulatory Visit: Payer: Self-pay

## 2024-08-04 NOTE — Telephone Encounter (Signed)
 FYI Only or Action Required?: FYI only for provider.  Patient was last seen in primary care on 06/02/2024 by Nedra Tinnie LABOR, NP.  Called Nurse Triage reporting Numbness.  Symptoms began about a month ago.  Interventions attempted: Nothing.  Symptoms are: unchanged.  Triage Disposition: See PCP When Office is Open (Within 3 Days)  Patient/caregiver understands and will follow disposition?: yes   Copied from CRM #[id number]. Topic: Clinical - Red Word Triage >> Aug 04, 2024  9:50 AM [place] J wrote: [facility] that prompted transfer to Nurse Triage: Patient is completley numb on his right side. Reason for Disposition  [1] Numbness or tingling in one or both hands AND [2] is a chronic symptom (recurrent or ongoing AND present > 4 weeks)  Answer Assessment - Initial Assessment Questions 1. SYMPTOM: What is the main symptom you are concerned about? (e.g., weakness, numbness)     R sided numbness  2. ONSET: When did this start? (e.g., minutes, hours, days; while sleeping)     About a month 3. LAST NORMAL: When was the last time you (the patient) were normal (no symptoms)?     About a month ago 4. PATTERN Does this come and go, or has it been constant since it started?  Is it present now?     constant 5. CARDIAC SYMPTOMS: Have you had any of the following symptoms: chest pain, difficulty breathing, palpitations?     denies 6. NEUROLOGIC SYMPTOMS: Have you had any of the following symptoms: headache, dizziness, vision loss, double vision, changes in speech, unsteady on your feet?     Intermittent headache, blurry vision for about a month,  7. OTHER SYMPTOMS: Do you have any other symptoms?     Denies SOB, denies CP  RN confirmed multiple times that this numbness started about a month ago, pt denies worsening numbness. Pt advised to call back if sx worsen. Pt confirmed understanding.  Protocols used: Neurologic Deficit-A-AH

## 2024-08-05 ENCOUNTER — Telehealth: Payer: Self-pay | Admitting: Nurse Practitioner

## 2024-08-05 ENCOUNTER — Encounter: Payer: Self-pay | Admitting: Nurse Practitioner

## 2024-08-05 ENCOUNTER — Ambulatory Visit (INDEPENDENT_AMBULATORY_CARE_PROVIDER_SITE_OTHER): Admitting: Nurse Practitioner

## 2024-08-05 VITALS — BP 134/76 | HR 66 | Temp 98.2°F | Ht 67.0 in | Wt 129.8 lb

## 2024-08-05 DIAGNOSIS — Z8673 Personal history of transient ischemic attack (TIA), and cerebral infarction without residual deficits: Secondary | ICD-10-CM

## 2024-08-05 DIAGNOSIS — R200 Anesthesia of skin: Secondary | ICD-10-CM | POA: Diagnosis not present

## 2024-08-05 DIAGNOSIS — Z7984 Long term (current) use of oral hypoglycemic drugs: Secondary | ICD-10-CM | POA: Diagnosis not present

## 2024-08-05 DIAGNOSIS — R0982 Postnasal drip: Secondary | ICD-10-CM

## 2024-08-05 DIAGNOSIS — E119 Type 2 diabetes mellitus without complications: Secondary | ICD-10-CM

## 2024-08-05 DIAGNOSIS — R531 Weakness: Secondary | ICD-10-CM | POA: Diagnosis not present

## 2024-08-05 DIAGNOSIS — Z5900 Homelessness unspecified: Secondary | ICD-10-CM | POA: Diagnosis not present

## 2024-08-05 MED ORDER — LORATADINE 10 MG PO TABS: 10.0000 mg | ORAL_TABLET | Freq: Every day | ORAL | 11 refills | Status: DC

## 2024-08-05 MED ORDER — GABAPENTIN 300 MG PO CAPS: 600.0000 mg | ORAL_CAPSULE | Freq: Three times a day (TID) | ORAL | 2 refills | Status: DC

## 2024-08-05 NOTE — Progress Notes (Signed)
 Established Patient Office Visit  Subjective   Patient ID: Gary Gay, male    DOB: 1955/11/06  Age: 68 y.o. MRN: [id number]  Chief Complaint  Patient presents with   Numbness    Right sided numbness and stiffness for the past 2 months.    HPI Discussed the use of AI scribe software for clinical note transcription with the patient, who gave verbal consent to proceed.  History of Present Illness   Sollie Vultaggio is a 68 year old male who presents with increased frequency of falls and numbness in his extremities.   Over the past two weeks, he has experienced four falls, occurring suddenly without warning, unrelated to tripping or lightheadedness. His leg buckles unexpectedly, prompting the use of a cane. He reports significant numbness and tightness in his fingers on his right side extending from his hand to his arm, impacting his ability to grip and move his arm. This has affected his work, as he was unable to hold a shovel or pipe, leading to another fall. This has been occurring and slowly worsening since his stroke.   He takes gabapentin , four pills at night and two during the day, to manage symptoms. Without the nighttime dose, sleep is disrupted due to discomfort. He also takes metformin  for blood sugar management but has not checked his blood sugar levels recently.  He describes shoulder discomfort with a popping noise from his shoulder blade to his neck during certain movements, with nighttime discomfort despite gabapentin  use. He has nasal drainage, previously managed with Claritin  and a nasal spray, but symptoms returned after running out of Claritin . He has gained weight, now 129 pounds, attributing this to increased snacking, and expresses pride in this gain. He experiences occasional shortness of breath. He reports no current foot issues and is impressed with its healing after a recent visit.       ROS See pertinent positives and negatives per HPI.    Objective:     BP  134/76 (BP Location: Left Arm, Cuff Size: Normal)   Pulse 66   Temp 98.2 F (36.8 C) (Oral)   Ht 5' 7 (1.702 m)   Wt 129 lb 12.8 oz (58.9 kg)   SpO2 100%   BMI 20.33 kg/m  BP Readings from Last 3 Encounters:  08/05/24 134/76  06/02/24 112/72  05/10/24 114/70   Wt Readings from Last 3 Encounters:  08/05/24 129 lb 12.8 oz (58.9 kg)  06/02/24 119 lb 12.8 oz (54.3 kg)  05/10/24 128 lb 6.4 oz (58.2 kg)      Physical Exam Vitals and nursing note reviewed.  Constitutional:      Appearance: Normal appearance.  HENT:     Head: Normocephalic.  Eyes:     Conjunctiva/sclera: Conjunctivae normal.  Cardiovascular:     Rate and Rhythm: Normal rate and regular rhythm.     Pulses: Normal pulses.     Heart sounds: Normal heart sounds.  Pulmonary:     Effort: Pulmonary effort is normal.     Breath sounds: Normal breath sounds.  Musculoskeletal:        General: No swelling or tenderness.     Cervical back: Normal range of motion.     Comments: Limited ROM in right hand and shoulder  Skin:    General: Skin is warm.  Neurological:     General: No focal deficit present.     Mental Status: He is alert and oriented to person, place, and time.     Motor:  Weakness present.  Psychiatric:        Mood and Affect: Mood normal.        Behavior: Behavior normal.        Thought Content: Thought content normal.        Judgment: Judgment normal.       Assessment & Plan:   Problem List Items Addressed This Visit       Endocrine   Diabetes mellitus without complication (HCC) - Primary   Chronic, ongoing Encouraged him to take his metformin  XR 1,000mg  daily. Will check labs next visit. Recommend yearly eye exam. Follow-up in 4 weeks.         Other   History of CVA (cerebrovascular accident)   He has a history of CVA.  Continue aspirin  81 mg daily and atorvastatin  80mg  daily.       Numbness   He suffers from peripheral neuropathy with numbness and tightness in the fingers extending  from the right hand to the arm, affecting his ability to work and perform daily activities. He also has symptoms in his right leg. He has limited mobility in his right hand and leg. He is applying for disability. Adjust gabapentin  dosing to 600mg  TID.       Homeless   He is currently living with his cousin in a run down house. [facility] has tried to reach out to him multiple times, however he didn't answer and they couldn't leave a message. Gave him the phone number to reach out to the case manager.       Long term current use of oral hypoglycemic drug   Continue metformin  XR 1,000mg  daily      Weakness   He has experienced gait instability with recurrent falls over the past two weeks, occurring four times. The falls are sudden and not linked to tripping or lightheadedness. Continue using a cane for support. He did see PT at home once, however wouldn't let them come inside and they have not been back since. He is working with the [employer] as well to start disability process.       Postnasal drip   He has chronic rhinitis with persistent nasal drainage. Initial improvement was noted with Claritin , but symptoms returned, and he is currently out of Claritin . Prescribe additional Claritin  and continue using nasal spray as needed.        Return in about 4 weeks (around 09/02/2024) for Diabetes.    Tinnie DELENA Harada, NP

## 2024-08-05 NOTE — Assessment & Plan Note (Signed)
 He has chronic rhinitis with persistent nasal drainage. Initial improvement was noted with Claritin , but symptoms returned, and he is currently out of Claritin . Prescribe additional Claritin  and continue using nasal spray as needed.

## 2024-08-05 NOTE — Assessment & Plan Note (Signed)
 He is currently living with his cousin in a run down house. [facility] has tried to reach out to him multiple times, however he didn't answer and they couldn't leave a message. Gave him the phone number to reach out to the case manager.

## 2024-08-05 NOTE — Patient Instructions (Signed)
 It was great to see you!  Let's change your gabapentin  to 2 tablets 3 times a day.   Our case managers have been trying to call you to help with any assistance available for you - this is no cost to you - you can call them at:   [phone]   Let's follow-up in 4 weeks, sooner if you have concerns.  If a referral was placed today, you will be contacted for an appointment. Please note that routine referrals can sometimes take up to 3-4 weeks to process. Please call our office if you haven't heard anything after this time frame.  Take care,  Tinnie Harada, NP

## 2024-08-05 NOTE — Assessment & Plan Note (Signed)
Continue metformin XR 1,000mg  daily.

## 2024-08-05 NOTE — Assessment & Plan Note (Signed)
 He has a history of CVA.  Continue aspirin  81 mg daily and atorvastatin  80mg  daily.

## 2024-08-05 NOTE — Assessment & Plan Note (Signed)
 He has experienced gait instability with recurrent falls over the past two weeks, occurring four times. The falls are sudden and not linked to tripping or lightheadedness. Continue using a cane for support. He did see PT at home once, however wouldn't let them come inside and they have not been back since. He is working with the [employer] as well to start disability process.

## 2024-08-05 NOTE — Assessment & Plan Note (Signed)
 Chronic, ongoing Encouraged him to take his metformin  XR 1,000mg  daily. Will check labs next visit. Recommend yearly eye exam. Follow-up in 4 weeks.

## 2024-08-05 NOTE — Assessment & Plan Note (Signed)
 He suffers from peripheral neuropathy with numbness and tightness in the fingers extending from the right hand to the arm, affecting his ability to work and perform daily activities. He also has symptoms in his right leg. He has limited mobility in his right hand and leg. He is applying for disability. Adjust gabapentin  dosing to 600mg  TID.

## 2024-08-08 ENCOUNTER — Other Ambulatory Visit: Payer: Self-pay | Admitting: Nurse Practitioner

## 2024-08-08 DIAGNOSIS — Z8673 Personal history of transient ischemic attack (TIA), and cerebral infarction without residual deficits: Secondary | ICD-10-CM

## 2024-08-08 DIAGNOSIS — I10 Essential (primary) hypertension: Secondary | ICD-10-CM

## 2024-08-08 DIAGNOSIS — Z5900 Homelessness unspecified: Secondary | ICD-10-CM

## 2024-08-08 DIAGNOSIS — Z5941 Food insecurity: Secondary | ICD-10-CM

## 2024-08-08 DIAGNOSIS — E119 Type 2 diabetes mellitus without complications: Secondary | ICD-10-CM

## 2024-08-08 NOTE — Telephone Encounter (Signed)
 Is it possible to have a Systems analyst join the upcoming in-person visit with Gary Harada, NP at [facility] on November 7th at 10:00 AM. [name] noticed multiple outreach attempts to the patient, and having someone onsite would be incredibly helpful for him.

## 2024-08-10 ENCOUNTER — Telehealth: Payer: Self-pay

## 2024-08-10 NOTE — Progress Notes (Signed)
 Complex Care Management Note  Care Guide Note 08/10/2024 Name: Om Lizotte MRN: [id number] DOB: 12-22-1955  Angela Platner is a 68 y.o. year old male who sees McElwee, Lauren A, NP for primary care. I reached out to Axle Isenhower by phone today to offer complex care management services.  Mr. Wire was given information about Complex Care Management services today including:   The Complex Care Management services include support from the care team which includes your Nurse Care Manager, Clinical Social Worker, or Pharmacist.  The Complex Care Management team is here to help remove barriers to the health concerns and goals most important to you. Complex Care Management services are voluntary, and the patient may decline or stop services at any time by request to their care team member.   Complex Care Management Consent Status: Patient agreed to services and verbal consent obtained.   Follow up plan:  Telephone appointment with complex care management team member scheduled for:  08/22/24 at 3:00 p.m.   Encounter Outcome:  Patient Scheduled  Dreama Lynwood [facility]  [facility], [facility] VBCI Assistant Direct Dial: [phone]  Fax: [phone]

## 2024-08-15 NOTE — Progress Notes (Signed)
 Gary Gay                                          MRN: [id number]   08/15/2024   The VBCI Quality Team Specialist reviewed this patient medical record for the purposes of chart review for care gap closure. The following were reviewed: abstraction for care gap closure-glycemic status assessment.    VBCI Quality Team

## 2024-08-15 NOTE — Progress Notes (Signed)
 Masao Junker                                          MRN: [id number]   08/15/2024   The VBCI Quality Team Specialist reviewed this patient medical record for the purposes of chart review for care gap closure. The following were reviewed: chart review for care gap closure-diabetic eye exam and kidney health evaluation for diabetes:eGFR  and uACR.    VBCI Quality Team

## 2024-08-19 ENCOUNTER — Telehealth: Payer: Self-pay | Admitting: *Deleted

## 2024-08-19 NOTE — Progress Notes (Signed)
 Complex Care Management Note Care Guide Note  08/19/2024 Name: Gary Gay MRN: [id number] DOB: [birth year]   Complex Care Management Outreach Attempts: An unsuccessful telephone outreach was attempted today to offer the patient information about available complex care management services.  Follow Up Plan:  Additional outreach attempts will be made to offer the patient complex care management information and services.   Encounter Outcome:  No Answer [facility] Randee [facility] Health[facility] Health Care Guide  Direct Dial:[phone] Fax:[phone] Website: [email or web address]

## 2024-08-22 ENCOUNTER — Other Ambulatory Visit: Payer: Self-pay

## 2024-08-22 DIAGNOSIS — F320 Major depressive disorder, single episode, mild: Secondary | ICD-10-CM

## 2024-08-22 DIAGNOSIS — Z5941 Food insecurity: Secondary | ICD-10-CM

## 2024-08-22 DIAGNOSIS — Z5900 Homelessness unspecified: Secondary | ICD-10-CM
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
## 2024-08-22 NOTE — Patient Instructions (Signed)
 Visit Information  Thank you for taking time to visit with me today. Please don't hesitate to contact me if I can be of assistance to you before our next scheduled appointment.  Our next appointment is by telephone on 09/05/24 at 2:00 pm Please call the care guide team at [phone] if you need to cancel or reschedule your appointment.   Following is a copy of your care plan:   Goals Addressed             This Visit's Progress    VBCI RN Care Plan       Problems:  Care Coordination needs related to Food Insecurity  and Housing  Chronic Disease Management support and education needs related to DMII PHQ9 score patient receptive to LCSW referral  Goal: Over the next 90 days the Patient will continue to work with Medical Illustrator and/or Social Worker to address care management and care coordination needs related to DMII and SDOH needs as evidenced by adherence to care management team scheduled appointments     take all medications exactly as prescribed and will call provider for medication related questions as evidenced by patient report and/or review of chart    Patient will verbalize communication with LCSW within the next 60 days.  Interventions:   Evaluation of current treatment plan related to DMII, Limited access to food and Housing barriers self-management and patient's adherence to plan as established by provider. Discussed plans with patient for ongoing care management follow up and provided patient with direct contact information for care management team Social Work referral for signs/symptoms of depression as well as SDOH needs Collaboration with Leadership regarding plan of care PCP updated   SDOH Barriers Patient interviewed and SDOH assessment performed        SDOH Interventions    Flowsheet Row Patient Outreach Telephone from 08/22/2024 in [place] [facility] DEPARTMENT Clinical Support from 03/18/2024 in [facility] [place] [facility] at [facility] Visit from 03/02/2024 in [facility] [place] [facility] at [employer] Visit from 05/06/2023 in [facility] [place] [facility] at [employer] Visit from 01/01/2023 in [facility] [place] [facility] at [employer] Clinical Support from 12/01/2022 in [facility] [place] [facility] at [employer]  SDOH Interventions        Food Insecurity Interventions Other (Comment)  [referral to social work] Intervention Not Indicated -- -- -- Intervention Not Indicated  Housing Interventions --  [referral to social work] Intervention Not Indicated -- -- -- --  Transportation Interventions Intervention Not Indicated  [uses [employer] for transportation] AMB Referral  [has been able to do it but having trouble] -- -- -- Intervention Not Indicated  Utilities Interventions Intervention Not Indicated Intervention Not Indicated -- -- -- --  Alcohol Usage Interventions -- Intervention Not Indicated (Score <7) -- -- -- --  Depression Interventions/Treatment  --  [referral to LCSW] -- Patient refuses Treatment Patient refuses Treatment Medication, Counseling --  Financial Strain Interventions -- Intervention Not Indicated -- -- -- Intervention Not Indicated  Physical Activity Interventions -- Patient Declined -- -- -- Patient Refused, Other (Comments)  Stress Interventions -- Patient Declined -- -- -- Intervention Not Indicated  Social Connections Interventions -- Intervention Not Indicated -- -- -- --  Health Literacy Interventions -- Intervention Not Indicated -- -- -- --   Provided patient with information about care guide's attempt to contact him. RNCM provided contact number to care guide and encouraged to call. Collaborated with primary care provider re: LCSW  in office visit to meet with patient*   Patient Self-Care Activities:  Attend all scheduled provider appointments Call provider office for new concerns or questions  Take medications as prescribed    Plan:  Telephone follow up  appointment with care management team member scheduled for:  09/05/24 at 2:00 pm      Please call the Suicide and Crisis Lifeline: 988 call the [place]  National Suicide Prevention Lifeline: (914)354-9251 or TTY: 317-100-3223 TTY 437-057-4479) to talk to a trained counselor if you are experiencing a Mental Health or Behavioral Health Crisis or need someone to talk to.  Patient verbalized understanding of Care plan and visit instructions communicated this visit  Heddy Shutter, RN, MSN, BSN, CCM [place]  [facility], Population Health Case Manager Phone: [phone]

## 2024-08-23 ENCOUNTER — Telehealth: Payer: Self-pay

## 2024-08-23 NOTE — Progress Notes (Signed)
 Complex Care Management Note  Care Guide Note 08/23/2024 Name: Zyler Hyson MRN: [id number] DOB: 07-29-1956  Remijio Holleran is a 68 y.o. year old male who sees McElwee, Lauren A, NP for primary care. I reached out to Maycol Shatzer by phone today to offer complex care management services.  Mr. Chew was given information about Complex Care Management services today including:   The Complex Care Management services include support from the care team which includes your Nurse Care Manager, Clinical Social Worker, or Pharmacist.  The Complex Care Management team is here to help remove barriers to the health concerns and goals most important to you. Complex Care Management services are voluntary, and the patient may decline or stop services at any time by request to their care team member.   Complex Care Management Consent Status: Patient agreed to services and verbal consent obtained.   Follow up plan:  Telephone appointment with complex care management team member scheduled for:  08/26/24 at 10:00 a.m.   Encounter Outcome:  Patient Scheduled  Dreama Lynwood [facility]  [facility], [facility] VBCI Assistant Direct Dial: [phone]  Fax: [phone]

## 2024-08-24 ENCOUNTER — Other Ambulatory Visit: Payer: Self-pay | Admitting: Nurse Practitioner

## 2024-08-24 ENCOUNTER — Telehealth: Payer: Self-pay | Admitting: *Deleted

## 2024-08-24 MED ORDER — METFORMIN HCL ER 500 MG PO TB24: 1000.0000 mg | ORAL_TABLET | Freq: Every day | ORAL | 1 refills | Status: AC

## 2024-08-24 MED ORDER — GABAPENTIN 300 MG PO CAPS: 600.0000 mg | ORAL_CAPSULE | Freq: Three times a day (TID) | ORAL | 2 refills | Status: DC

## 2024-08-24 MED ORDER — LORATADINE 10 MG PO TABS: 10.0000 mg | ORAL_TABLET | Freq: Every day | ORAL | 3 refills | Status: DC

## 2024-08-24 MED ORDER — FLUTICASONE PROPIONATE 50 MCG/ACT NA SUSP: 2.0000 | Freq: Every day | NASAL | 6 refills | Status: AC

## 2024-08-24 MED ORDER — LOSARTAN POTASSIUM-HCTZ 50-12.5 MG PO TABS: 1.0000 | ORAL_TABLET | Freq: Every day | ORAL | 3 refills | Status: AC

## 2024-08-24 MED ORDER — ATORVASTATIN CALCIUM 80 MG PO TABS: 80.0000 mg | ORAL_TABLET | Freq: Every day | ORAL | 3 refills | Status: AC

## 2024-08-24 NOTE — Progress Notes (Signed)
 Complex Care Management Note Care Guide Note  08/24/2024 Name: Gary Gay MRN: [id number] DOB: 01/22/1956  Gary Gay is a 68 y.o. year old male who is a primary care patient of McElwee, Lauren A, NP . The community resource team was consulted for assistance with Transportation Needs   SDOH screenings and interventions completed:  Yes  Social Drivers of Health From This Encounter   Financial Resource Strain: High Risk (08/24/2024)   Overall Financial Resource Strain (CARDIA)    Difficulty of Paying Living Expenses: Very hard    SDOH Interventions Today    Flowsheet Row Most Recent Value  SDOH Interventions   Food Insecurity Interventions Community Resources Provided, [employer] Referral  [Provided food banks and other homeless service s]  Housing Interventions Community Resources Provided  [employer] housing and veteran housing services provided]  Financial Strain Interventions Community Resources Provided, Event Organiser homeless with no resources but also connected to veteran services]  Social Connections Interventions Community Resources Provided     Care guide performed the following interventions: Patient provided with information about care guide support team and interviewed to confirm resource needs.  Follow Up Plan:  No further follow up planned at this time. The patient has been provided with needed resources.  Encounter Outcome:  Patient Visit Completed  Gary Gay  [facility] HealthPopulation Health Care Guide  Direct Dial:[phone] Fax:[phone] Website: [email or web address]

## 2024-08-26 ENCOUNTER — Telehealth: Payer: Self-pay

## 2024-08-26 ENCOUNTER — Other Ambulatory Visit: Admitting: Licensed Clinical Social Worker

## 2024-08-26 NOTE — Patient Instructions (Signed)
 Visit Information  Thank you for taking time to visit with me today. Please don't hesitate to contact me if I can be of assistance to you before our next scheduled appointment.  Our next appointment is in-person at @PCP @'s office on 09/02/24 at 10am. Please call the care guide team at [phone] if you need to cancel or reschedule your appointment.   Following is a copy of your care plan:   Goals Addressed             This Visit's Progress    [facility] Social Work Care Plan LCSW       Problems:   Corporate Treasurer , Housing , and Transportation  CSW Clinical Goal(s):   Over the next 90 days the Patient will explore community resource options for unmet needs related to [employer] , Housing , and Transportation as evidence by using community resources.  Interventions:  Social Determinants of Health in Patient with Depression: depressed mood: SDOH assessments completed: Corporate Treasurer , Housing , and Transportation Evaluation of current treatment plan related to unmet needs Transportation resources: HOME DEPOT rides, bus Public Librarian house- left callback number Discussed [facility] services- patient attempted to apply for assistance but did not continue with process Discussed income (receives$3,000+ a month) Discussed getting on apartment with current income Discussed mental health support- declined  Patient Goals/Self-Care Activities: Continue with provider for ongoing medical treatment.   Continue taking your medication as prescribed.    Plan:   Face to Face appointment with care management team member scheduled for: 09/02/24 at 10am.        Please call the Suicide and Crisis Lifeline: 988 call the [place]  National Suicide Prevention Lifeline: (416) 712-9913 or TTY: 847-169-3128 TTY (608) 817-6404) to talk to a trained counselor call 1-800-273-TALK (toll free, 24 hour hotline) call 911 if you are experiencing a Mental Health or Behavioral Health Crisis or need someone to talk  to.  Patient verbalized understanding of Care plan and visit instructions communicated this visit  Cena Ligas, LCSW Clinical Social Worker [facility] [employer]

## 2024-08-26 NOTE — Patient Outreach (Signed)
 Complex Care Management   Visit Note  08/26/2024  Name:  Gary Gay MRN: [id number] DOB: [birth year]  Situation: Referral received for Complex Care Management related to SDOH Barriers:  Transportation, Housing, [employer]. I obtained verbal consent from Patient.  Visit completed with Patient  on the phone. Patient is homeless but has been staying with his cousin for the past 10 months. Patient reports housing is not up to par and would like to find another place to live. Patient is not interested in a shelter because he does not have reliable transportation. Currently patient uses his rides through [facility]. Patient is not interested in mental health supports at this time.  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    Assessment: Patient Reported Symptoms:  Cognitive Cognitive Status: Alert and oriented to person, place, and time, Normal speech and language skills Cognitive/Intellectual Conditions Management [RPT]: None reported or documented in medical history or problem list      Neurological Neurological Review of Symptoms: Not assessed    HEENT HEENT Symptoms Reported: Not assessed      Cardiovascular Cardiovascular Symptoms Reported: Not assessed    Respiratory Respiratory Symptoms Reported: Not assesed    Endocrine Endocrine Symptoms Reported: Not assessed    Gastrointestinal Gastrointestinal Symptoms Reported: Not assessed      Genitourinary Genitourinary Symptoms Reported: Not assessed    Integumentary Integumentary Symptoms Reported: Not assessed    Musculoskeletal Musculoskelatal Symptoms Reviewed: Not assessed        Psychosocial Psychosocial Symptoms Reported: Not assessed          08/26/2024    PHQ2-9 Depression Screening   Little interest or pleasure in doing things    Feeling down, depressed, or hopeless    PHQ-2 - Total Score    Trouble  falling or staying asleep, or sleeping too much    Feeling tired or having little energy    Poor appetite or overeating     Feeling bad about yourself - or that you are a failure or have let yourself or your family down    Trouble concentrating on things, such as reading the newspaper or watching television    Moving or speaking so slowly that other people could have noticed.  Or the opposite - being so fidgety or restless that you have been moving around a lot more than usual    Thoughts that you would be better off dead, or hurting yourself in some way    PHQ2-9 Total Score    If you checked off any problems, how difficult have these problems made it for you to do your work, take care of things at home, or get along with other people    Depression Interventions/Treatment      There were no vitals filed for this visit.  Medications Reviewed Today   Medications were not reviewed in this encounter     Recommendation:   PCP Follow-up Continue Current Plan of Care  Follow Up Plan:   Face to Face appointment date/time: 09/02/24 at 10am.  Cena Ligas, LCSW Clinical Social Worker [facility] Population [facility]

## 2024-08-26 NOTE — Patient Outreach (Signed)
 Complex Care Management   Visit Note  08/26/2024  Name:  Gary Gay MRN: [id number] DOB: 1956-06-30  Situation: Care Coordination/Collaboration with EMERSON Ligas, LCSW regarding plan of care  Follow Up Plan:   LCSW to meet with patient at next PCP office visit. RNCM will continue to follow as scheduled.   Heddy Shutter, RN, MSN, BSN, CCM [place]  [facility], Population Health Case Manager Phone: [phone]

## 2024-08-31 ENCOUNTER — Ambulatory Visit: Payer: Self-pay

## 2024-08-31 NOTE — Telephone Encounter (Signed)
 FYI Only or Action Required?: Action required by provider: clinical question for provider.  Patient was last seen in primary care on 08/05/2024 by Nedra Tinnie LABOR, NP.  Called Nurse Triage reporting Sinusitis.  Symptoms began a couple of days ago.  Symptoms are: gradually worsening.  Triage Disposition: See PCP When Office is Open (Within 3 Days)  Patient/caregiver understands and will follow disposition?: Yes       Copied from CRM #[id number]. Topic: Clinical - Red Word Triage >> Aug 31, 2024  3:23 PM Eva FALCON wrote: Red Word that prompted transfer to Nurse Triage: Pt is having real bad congestion, head pressure, rattling in chest when coughing, and coughing up mucus.        Reason for Disposition  Lots of coughing  Answer Assessment - Initial Assessment Questions Patient has an appointment on 11/7 and would like to see if he can get a medication to help his symptoms until then. Please advise.     1. LOCATION: Where does it hurt?      Headache intermittently  2. ONSET: When did the sinus pain start?  (e.g., hours, days)      A couple of days ago  3. SEVERITY: How bad is the pain?   (Scale 0-10; or none, mild, moderate or severe)     8/10 4. RECURRENT SYMPTOM: Have you ever had sinus problems before? If Yes, ask: When was the last time? and What happened that time?      No 5. NASAL CONGESTION: Is the nose blocked? If Yes, ask: Can you open it or must you breathe through your mouth?     Yes 6. NASAL DISCHARGE: Do you have discharge from your nose? If so ask, What color?     No 7. FEVER: Do you have a fever? If Yes, ask: What is it, how was it measured, and when did it start?      No 8. OTHER SYMPTOMS: Do you have any other symptoms? (e.g., sore throat, cough, earache, difficulty breathing)     Cough, lightheadedness  Protocols used: Sinus Pain or Congestion-A-AH

## 2024-09-01 NOTE — Telephone Encounter (Signed)
 Noted. Patient has an appointment on 09/02/24 with Lauren.

## 2024-09-02 ENCOUNTER — Other Ambulatory Visit: Admitting: Licensed Clinical Social Worker

## 2024-09-02 ENCOUNTER — Encounter: Payer: Self-pay | Admitting: Nurse Practitioner

## 2024-09-02 ENCOUNTER — Ambulatory Visit: Admitting: Nurse Practitioner

## 2024-09-02 VITALS — BP 114/78 | HR 97 | Temp 98.1°F | Ht 67.0 in | Wt 126.4 lb

## 2024-09-02 DIAGNOSIS — Z7984 Long term (current) use of oral hypoglycemic drugs: Secondary | ICD-10-CM

## 2024-09-02 DIAGNOSIS — E1140 Type 2 diabetes mellitus with diabetic neuropathy, unspecified: Secondary | ICD-10-CM

## 2024-09-02 DIAGNOSIS — F321 Major depressive disorder, single episode, moderate: Secondary | ICD-10-CM

## 2024-09-02 DIAGNOSIS — I10 Essential (primary) hypertension: Secondary | ICD-10-CM

## 2024-09-02 DIAGNOSIS — J22 Unspecified acute lower respiratory infection: Secondary | ICD-10-CM

## 2024-09-02 LAB — POCT GLYCOSYLATED HEMOGLOBIN (HGB A1C)
HbA1c POC (<> result, manual entry): 6.6 % (ref 4.0–5.6)
HbA1c, POC (controlled diabetic range): 6.6 % (ref 0.0–7.0)
HbA1c, POC (prediabetic range): 6.6 % — AB (ref 5.7–6.4)
Hemoglobin A1C: 6.6 % — AB (ref 4.0–5.6)

## 2024-09-02 MED ORDER — SERTRALINE HCL 50 MG PO TABS: 50.0000 mg | ORAL_TABLET | Freq: Every day | ORAL | 1 refills | Status: DC

## 2024-09-02 MED ORDER — AMOXICILLIN-POT CLAVULANATE 875-125 MG PO TABS: 1.0000 | ORAL_TABLET | Freq: Two times a day (BID) | ORAL | 0 refills | Status: DC

## 2024-09-02 NOTE — Patient Instructions (Addendum)
 It was great to see you!  Start augmentin twice a day for 10 days  You can keep taking claritin  and drinking plenty of water  Start zoloft 1 tablet daily to help with your mood   [facility] is open 24/7 and is a walk-in urgent care  [facility] [address], [place], [place] [address] [phone]  Let's follow-up in 4 weeks, sooner if you have concerns.  If a referral was placed today, you will be contacted for an appointment. Please note that routine referrals can sometimes take up to 3-4 weeks to process. Please call our office if you haven't heard anything after this time frame.  Take care,  Tinnie Harada, NP

## 2024-09-02 NOTE — Assessment & Plan Note (Signed)
Continue metformin XR 1,000mg  daily.

## 2024-09-02 NOTE — Patient Outreach (Signed)
 Complex Care Management   Visit Note  09/02/2024  Name:  Gary Gay MRN: [id number] DOB: 13-Mar-1956  Situation: Referral received for Complex Care Management related to SDOH Barriers:  Housing   I obtained verbal consent from Patient.  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 LCSW and patient reviewed senior home resources. LCSW informed patient that he will have to call these places to inquire about vacancies and move in requirements. LCSW also gave patient crisis resources if he has to leave his cousins home.   Patient Reported Symptoms:  Cognitive Cognitive Status: Alert and oriented to person, place, and time, Normal speech and language skills      Neurological Neurological Review of Symptoms: Not assessed    HEENT HEENT Symptoms Reported: Not assessed      Cardiovascular Cardiovascular Symptoms Reported: Not assessed    Respiratory Respiratory Symptoms Reported: Not assesed    Endocrine Endocrine Symptoms Reported: Not assessed    Gastrointestinal Gastrointestinal Symptoms Reported: Not assessed      Genitourinary Genitourinary Symptoms Reported: Not assessed    Integumentary Integumentary Symptoms Reported: Not assessed    Musculoskeletal Musculoskelatal Symptoms Reviewed: Not assessed        Psychosocial Psychosocial Symptoms Reported: Not assessed          09/02/2024    PHQ2-9 Depression Screening   Little interest or pleasure in doing things    Feeling down, depressed, or hopeless    PHQ-2 - Total Score    Trouble falling or staying asleep, or sleeping too much    Feeling tired or having little energy    Poor appetite or overeating     Feeling bad about yourself - or that you are a failure or have let yourself or your family down    Trouble  concentrating on things, such as reading the newspaper or watching television    Moving or speaking so slowly that other people could have noticed.  Or the opposite - being so fidgety or restless that you have been moving around a lot more than usual    Thoughts that you would be better off dead, or hurting yourself in some way    PHQ2-9 Total Score    If you checked off any problems, how difficult have these problems made it for you to do your work, take care of things at home, or get along with other people    Depression Interventions/Treatment      There were no vitals filed for this visit.  Medications Reviewed Today   Medications were not reviewed in this encounter     Recommendation:   PCP Follow-up Continue Current Plan of Care  Follow Up Plan:   Telephone follow up appointment date/time:  09/13/24 at 2pm.  Cena Ligas, LCSW Clinical Social Worker [facility] Population [facility]

## 2024-09-02 NOTE — Assessment & Plan Note (Signed)
 Chronic, not controlled. He experiences hopelessness and thoughts of self-harm without a plan, along with frustration regarding family and living situation. He is open to medication but has transportation issues limiting therapy access. Prescribed Zoloft 50 mg daily. Discussed possible side effects. He is able to contract for safety. Gave him information on [facility]. Follow-up in 4 weeks or sooner with concerns.

## 2024-09-02 NOTE — Progress Notes (Signed)
 Established Patient Office Visit  Subjective   Patient ID: Gary Gay, male    DOB: [birth year]  Age: 68 y.o. MRN: [id number]  Chief Complaint  Patient presents with   Diabetes    Follow up, sinus problems for 4 days and concerns with feeling dehydrated    HPI Discussed the use of AI scribe software for clinical note transcription with the patient, who gave verbal consent to proceed.  History of Present Illness   Gary Gay is a 68 year old male with diabetes who presents with sinus infection symptoms and concerns about his mental health.  He experiences a runny nose and cough without facial pain or shortness of breath. Fever occurs at night and resolves by morning. Blisters are present on his lip. He has been taking claritin  for his symptoms. He notes this was helping for a few days, but then symptoms worsened again.   Diabetes is managed with metformin , but he has not checked blood sugars due to his glucometer being in storage. He has ongoing neuropathy in his feet and is taking gabapentin  600mg  TID.   Significant social stressors include conflicts with his cousin and sister, contributing to his mental health concerns. He reports mood swings, feelings of wanting to disappear, and dissatisfaction with previous VA support. He lacks a specific self-harm plan.        09/02/2024   10:51 AM 08/22/2024    2:41 PM 06/02/2024   10:50 AM 03/02/2024   12:02 PM 08/06/2023    9:22 AM  Depression screen PHQ 2/9  Decreased Interest 2 0 2 2 0  Down, Depressed, Hopeless 2 3 1 2  0  PHQ - 2 Score 4 3 3 4  0  Altered sleeping 3 3 1 2  0  Tired, decreased energy 2 0 1 2 0  Change in appetite 3 0 1 2 3   Feeling bad or failure about yourself  3 1 1 2  0  Trouble concentrating 2 0 1 2 0  Moving slowly or fidgety/restless 3 0 2 2 0  Suicidal thoughts 3 0 2 2 0  PHQ-9 Score 23 7  12  18  3    Difficult doing work/chores Very difficult Somewhat difficult Not difficult at all Somewhat difficult Somewhat  difficult     Data saved with a previous flowsheet row definition      09/02/2024   10:51 AM 06/02/2024   10:50 AM 08/06/2023    9:22 AM 04/15/2023    9:05 AM  GAD 7 : Generalized Anxiety Score  Nervous, Anxious, on Edge 2 2 0 3  Control/stop worrying 2 0 2 2  Worry too much - different things 2 0 0 2  Trouble relaxing 2 0 0 1  Restless 2 0 0 0  Easily annoyed or irritable 3 2 0 3  Afraid - awful might happen 3 1 0 2  Total GAD 7 Score 16 5 2 13   Anxiety Difficulty Very difficult Not difficult at all  Somewhat difficult    ROS See pertinent positives and negatives per HPI.    Objective:     BP 114/78 (BP Location: Left Arm, Patient Position: Sitting, Cuff Size: Small)   Pulse 97   Temp 98.1 F (36.7 C) (Oral)   Ht 5' 7 (1.702 m)   Wt 126 lb 6.4 oz (57.3 kg)   SpO2 99%   BMI 19.80 kg/m  BP Readings from Last 3 Encounters:  09/02/24 114/78  08/05/24 134/76  06/02/24 112/72   Wt  Readings from Last 3 Encounters:  09/02/24 126 lb 6.4 oz (57.3 kg)  08/05/24 129 lb 12.8 oz (58.9 kg)  06/02/24 119 lb 12.8 oz (54.3 kg)      Physical Exam Vitals and nursing note reviewed.  Constitutional:      Appearance: Normal appearance.  HENT:     Head: Normocephalic.  Eyes:     Conjunctiva/sclera: Conjunctivae normal.  Cardiovascular:     Rate and Rhythm: Normal rate and regular rhythm.     Pulses: Normal pulses.     Heart sounds: Normal heart sounds.  Pulmonary:     Effort: Pulmonary effort is normal.     Breath sounds: Wheezing present.  Musculoskeletal:     Cervical back: Normal range of motion and neck supple. No tenderness.  Lymphadenopathy:     Cervical: No cervical adenopathy.  Skin:    General: Skin is warm.  Neurological:     General: No focal deficit present.     Mental Status: He is alert and oriented to person, place, and time.  Psychiatric:        Mood and Affect: Mood normal.        Behavior: Behavior normal.        Thought Content: Thought content  normal.        Judgment: Judgment normal.      Results for orders placed or performed in visit on 09/02/24  POCT glycosylated hemoglobin (Hb A1C)  Result Value Ref Range   Hemoglobin A1C 6.6 (A) 4.0 - 5.6 %   HbA1c POC (<> result, manual entry) 6.6 4.0 - 5.6 %   HbA1c, POC (prediabetic range) 6.6 (A) 5.7 - 6.4 %   HbA1c, POC (controlled diabetic range) 6.6 0.0 - 7.0 %       The ASCVD Risk score (Arnett DK, et al., 2019) failed to calculate for the following reasons:   Risk score cannot be calculated because patient has a medical history suggesting prior/existing ASCVD    Assessment & Plan:   Problem List Items Addressed This Visit       Cardiovascular and Mediastinum   Essential hypertension (Chronic)   Chronic, stable. Continue losartan -hydrochlorothiazide  50-12.5mg  daily. Follow-up in 6 months.         Endocrine   Type 2 diabetes mellitus with diabetic neuropathy, unspecified (HCC)   Chronic, stable. His diabetes is well-controlled with an A1c of 6.6%. He is not monitoring glucose due to glucometer in storage, though dietary improvements are noted. Continue metformin  1,000mg  daily. Recommend yearly eye exam.       Relevant Orders   POCT glycosylated hemoglobin (Hb A1C) (Completed)     Other   Long term current use of oral hypoglycemic drug   Continue metformin  XR 1,000mg  daily      Depression, major, single episode, moderate (HCC) - Primary   Chronic, not controlled. He experiences hopelessness and thoughts of self-harm without a plan, along with frustration regarding family and living situation. He is open to medication but has transportation issues limiting therapy access. Prescribed Zoloft 50 mg daily. Discussed possible side effects. He is able to contract for safety. Gave him information on [facility]. Follow-up in 4 weeks or sooner with concerns.       Relevant Medications   sertraline (ZOLOFT) 50 MG tablet   Other Visit  Diagnoses       Lower respiratory tract infection       With history of smoking, will treat with augmentin BID  x10 days. Encourage fluids. Can continue claritin  daily.      Return in about 4 weeks (around 09/30/2024) for Depression.    Gary DELENA Harada, NP

## 2024-09-02 NOTE — Assessment & Plan Note (Signed)
 Chronic, stable. His diabetes is well-controlled with an A1c of 6.6%. He is not monitoring glucose due to glucometer in storage, though dietary improvements are noted. Continue metformin  1,000mg  daily. Recommend yearly eye exam.

## 2024-09-02 NOTE — Assessment & Plan Note (Signed)
Chronic, stable. Continue losartan-hydrochlorothiazide 50-12.5mg  daily. Follow-up in 6 months.

## 2024-09-05 ENCOUNTER — Other Ambulatory Visit: Payer: Self-pay

## 2024-09-05 NOTE — Patient Instructions (Addendum)
 Visit Information  Thank you for taking time to visit with me today. Please don't hesitate to contact me if I can be of assistance to you before our next scheduled appointment.  Your next care management appointment is by telephone on 10/06/24 at 1:00 pm  Please call the care guide team at [phone] if you need to cancel, schedule, or reschedule an appointment.   Please call the Suicide and Crisis Lifeline: 988 call the [place]  National Suicide Prevention Lifeline: 405-358-7945 or TTY: 254-388-7052 TTY 508-290-4152) to talk to a trained counselor if you are experiencing a Mental Health or Behavioral Health Crisis or need someone to talk to.  Heddy Shutter, RN, MSN, BSN, CCM [place]  [facility], Population Health Case Manager Phone: [phone]

## 2024-09-05 NOTE — Patient Outreach (Signed)
 Complex Care Management   Visit Note  09/05/2024  Name:  Gary Gay MRN: [id number] DOB: [birth year]  Situation: Referral received for Complex Care Management related to SDOH needs: Housing instability, Food insecurity  I obtained verbal consent from Patient.  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    Assessment: Patient Reported Symptoms:  Cognitive Cognitive Status: No symptoms reported      Neurological Neurological Review of Symptoms: No symptoms reported    HEENT HEENT Symptoms Reported: Other: (patient reports being treated for sinus infection at this time)      Cardiovascular Cardiovascular Symptoms Reported: No symptoms reported    Respiratory Respiratory Symptoms Reported: No symptoms reported    Endocrine Endocrine Symptoms Reported: No symptoms reported Is patient diabetic?: Yes Is patient checking blood sugars at home?: Yes List most recent blood sugar readings, include date and time of day: patient reports last checked this am BS 122(non-fasting)    Gastrointestinal Gastrointestinal Symptoms Reported: No symptoms reported      Genitourinary Genitourinary Symptoms Reported: No symptoms reported    Integumentary Integumentary Symptoms Reported: No symptoms reported    Musculoskeletal Musculoskelatal Symptoms Reviewed: No symptoms reported Additional Musculoskeletal Details: using cane to assist with walking   Falls in the past year?:  (no falls since last encounter)    Psychosocial Additional Psychological Details: awaiting for prescription for Zoloft. discussed mental health services-patient reports he has the contact number for behavior health urgent care. RNCM discussed VA services. patient reports he got upset with the [facility] and has not used their services in a while. He states transportation into [place]  would be a challenge for him.          There were no vitals filed for this visit.  Medications Reviewed Today     Reviewed by Cynthea Zachman M, RN (Registered Nurse) on 09/05/24 at 1416  Med List Status: <None>   Medication Order Taking? Sig Documenting Provider Last Dose Status Informant  amoxicillin-clavulanate (AUGMENTIN) 875-125 MG tablet [id number] Yes Take 1 tablet by mouth 2 (two) times daily. McElwee, Lauren A, NP  Active   aspirin  EC 81 MG tablet [id number] Yes  [provider]  Active   atorvastatin  (LIPITOR) 80 MG tablet [id number] Yes Take 1 tablet (80 mg total) by mouth daily. McElwee, Lauren A, NP  Active   diclofenac  Sodium (VOLTAREN ) 1 % GEL [id number] Yes Apply 2 g topically 4 (four) times daily. McElwee, Lauren A, NP  Active   fluticasone  (FLONASE ) 50 MCG/ACT nasal spray [id number] Yes Place 2 sprays into both nostrils daily. McElwee, Lauren A, NP  Active   gabapentin  (NEURONTIN ) 300 MG capsule [id number] Yes Take 2 capsules (600 mg total) by mouth 3 (three) times daily. McElwee, Lauren A, NP  Active   Iron , Ferrous Sulfate , 325 (65 Fe) MG TABS [id number] Yes Take 325 mg by mouth daily. McElwee, Lauren A, NP  Active   loratadine  (CLARITIN ) 10 MG tablet [id number] Yes Take 1 tablet (10 mg total) by mouth daily. McElwee, Lauren A, NP  Active   losartan -hydrochlorothiazide  (HYZAAR) 50-12.5 MG tablet [id number] Yes Take 1 tablet by mouth daily. McElwee, Lauren A, NP  Active   metFORMIN  (GLUCOPHAGE -XR) 500 MG 24 hr tablet [id number] Yes Take 2 tablets (1,000 mg total) by mouth daily with breakfast. TAKE 2 TABLETS(1000 MG) BY MOUTH  DAILY WITH BREAKFAST McElwee, Lauren A, NP  Active   sertraline (ZOLOFT) 50 MG tablet [id number] Yes Take 1 tablet (50 mg total) by mouth daily. Nedra Tinnie LABOR, NP  Active           Recommendation:   Continue Current Plan of Care  Follow Up Plan:   Telephone follow up appointment date/time:  10/06/24 at 1:00 pm  Heddy Shutter, RN, MSN, BSN,  CCM [place]  [facility], Population Health Case Manager Phone: [phone]

## 2024-09-13 ENCOUNTER — Encounter: Payer: Self-pay | Admitting: Licensed Clinical Social Worker

## 2024-09-13 ENCOUNTER — Telehealth: Payer: Self-pay | Admitting: Licensed Clinical Social Worker

## 2024-09-20 ENCOUNTER — Telehealth: Payer: Self-pay | Admitting: Licensed Clinical Social Worker

## 2024-09-20 NOTE — Patient Outreach (Signed)
 LCSW received call back from patient. Patient states he is doing well. There has not been any other conflicts between him and his cousin. Patient stated that he calle d a lot of places on the housing list but the numbers either didn't work or he had to leave a message ad no one called him back. LCSW will assist member in identifying available housing. LCSW reminded patient of next appointment which is 09/30/24 at 2pm.   Cena Ligas, LCSW Clinical Social Worker [facility]

## 2024-09-30 ENCOUNTER — Encounter: Payer: Self-pay | Admitting: Licensed Clinical Social Worker

## 2024-09-30 ENCOUNTER — Telehealth: Payer: Self-pay | Admitting: Licensed Clinical Social Worker

## 2024-09-30 NOTE — Patient Instructions (Signed)
 Gary Gay - I am sorry I was unable to reach you today for our scheduled appointment. I work with Nedra Tinnie LABOR, NP and am calling to support your healthcare needs. Please contact me at [phone] at your earliest convenience. I look forward to speaking with you soon.   Thank you,  Cena Ligas, LCSW Clinical Social Worker [facility]

## 2024-10-03 ENCOUNTER — Telehealth: Payer: Self-pay

## 2024-10-03 ENCOUNTER — Encounter: Payer: Self-pay | Admitting: Nurse Practitioner

## 2024-10-03 ENCOUNTER — Ambulatory Visit: Admitting: Nurse Practitioner

## 2024-10-03 ENCOUNTER — Other Ambulatory Visit (HOSPITAL_COMMUNITY): Payer: Self-pay

## 2024-10-03 VITALS — BP 118/82 | HR 82 | Temp 97.6°F | Ht 67.0 in | Wt 125.0 lb

## 2024-10-03 DIAGNOSIS — Z7984 Long term (current) use of oral hypoglycemic drugs: Secondary | ICD-10-CM

## 2024-10-03 DIAGNOSIS — R0982 Postnasal drip: Secondary | ICD-10-CM

## 2024-10-03 DIAGNOSIS — R21 Rash and other nonspecific skin eruption: Secondary | ICD-10-CM

## 2024-10-03 DIAGNOSIS — F321 Major depressive disorder, single episode, moderate: Secondary | ICD-10-CM

## 2024-10-03 DIAGNOSIS — E1140 Type 2 diabetes mellitus with diabetic neuropathy, unspecified: Secondary | ICD-10-CM

## 2024-10-03 MED ORDER — FREESTYLE LIBRE 3 PLUS SENSOR MISC: 3 refills | Status: AC

## 2024-10-03 MED ORDER — SERTRALINE HCL 100 MG PO TABS: 100.0000 mg | ORAL_TABLET | Freq: Every day | ORAL | 1 refills | Status: AC

## 2024-10-03 MED ORDER — BLOOD GLUCOSE TEST VI STRP: 1.0000 | ORAL_STRIP | Freq: Every day | 0 refills | Status: AC

## 2024-10-03 MED ORDER — BLOOD GLUCOSE MONITORING SUPPL DEVI: 1.0000 | Freq: Every day | 0 refills | Status: AC

## 2024-10-03 MED ORDER — MUPIROCIN 2 % EX OINT: 1.0000 | TOPICAL_OINTMENT | Freq: Two times a day (BID) | CUTANEOUS | 1 refills | Status: AC

## 2024-10-03 MED ORDER — LORATADINE 10 MG PO TABS: 10.0000 mg | ORAL_TABLET | Freq: Every day | ORAL | 3 refills | Status: AC

## 2024-10-03 NOTE — Patient Instructions (Addendum)
 It was great to see you!  Let's increase your zoloft  to 100mg  daily. I have sent this into your pharmacy ([facility])  Start antibiotic cream to the spots on your legs twice a day   Let's follow-up in 2-3 months, sooner if you have concerns.  If a referral was placed today, you will be contacted for an appointment. Please note that routine referrals can sometimes take up to 3-4 weeks to process. Please call our office if you haven't heard anything after this time frame.  Take care,  Tinnie Harada, NP

## 2024-10-03 NOTE — Progress Notes (Signed)
 Established Patient Office Visit  Subjective   Patient ID: Gary Gay, male    DOB: 1956-09-01  Age: 68 y.o. MRN: [id number]  Chief Complaint  Patient presents with   Depression    Follow up    HPI  Discussed the use of AI scribe software for clinical note transcription with the patient, who gave verbal consent to proceed.  History of Present Illness   Gary Gay is a 68 year old male who presents for follow-up on depression.  He takes Zoloft  for depression and feels it is overall helpful, with a mellowing effect but increased irritability, especially during interactions with his cousin. He is open to dosage adjustment to further improve mood and irritability.  He recently had a viral illness that improved with treatment but now has throat drainage and hoarseness. He is out of Claritin  but continues to use Flonase , which helps.  He has difficulty eating more than two meals a day due to transportation and limited kitchen facilities. He lives with a cousin and his strained relationship affects social interactions and meal preparation.  He has scabbed lesions on his legs that do not itch or hurt but bleed if disturbed. He has seen some improvement with dietary changes.  He uses marijuana for right-sided muscle and nerve pain and finds it helps relax his leg. He has no chest pain or shortness of breath.        10/03/2024    9:05 AM 09/02/2024   10:51 AM 08/22/2024    2:41 PM 06/02/2024   10:50 AM 03/02/2024   12:02 PM  Depression screen PHQ 2/9  Decreased Interest 0 2 0 2 2  Down, Depressed, Hopeless 3 2 3 1 2   PHQ - 2 Score 3 4 3 3 4   Altered sleeping 3 3 3 1 2   Tired, decreased energy 0 2 0 1 2  Change in appetite 2 3 0 1 2  Feeling bad or failure about yourself  2 3 1 1 2   Trouble concentrating 0 2 0 1 2  Moving slowly or fidgety/restless 2 3 0 2 2  Suicidal thoughts 2 3 0 2 2  PHQ-9 Score 14 23 7  12  18    Difficult doing work/chores Somewhat difficult Very difficult  Somewhat difficult Not difficult at all Somewhat difficult     Data saved with a previous flowsheet row definition      10/03/2024    9:07 AM 09/02/2024   10:51 AM 06/02/2024   10:50 AM 08/06/2023    9:22 AM  GAD 7 : Generalized Anxiety Score  Nervous, Anxious, on Edge 2 2 2  0  Control/stop worrying 2 2 0 2  Worry too much - different things 1 2 0 0  Trouble relaxing 2 2 0 0  Restless 1 2 0 0  Easily annoyed or irritable 3 3 2  0  Afraid - awful might happen 1 3 1  0  Total GAD 7 Score 12 16 5 2   Anxiety Difficulty Somewhat difficult Very difficult Not difficult at all       ROS See pertinent positives and negatives per HPI.    Objective:     BP 118/82 (BP Location: Left Arm, Patient Position: Sitting, Cuff Size: Small)   Pulse 82   Temp 97.6 F (36.4 C)   Ht 5' 7 (1.702 m)   Wt 125 lb (56.7 kg)   SpO2 99%   BMI 19.58 kg/m  BP Readings from Last 3 Encounters:  10/03/24 118/82  09/02/24 114/78  08/05/24 134/76   Wt Readings from Last 3 Encounters:  10/03/24 125 lb (56.7 kg)  09/02/24 126 lb 6.4 oz (57.3 kg)  08/05/24 129 lb 12.8 oz (58.9 kg)      Physical Exam Vitals and nursing note reviewed.  Constitutional:      Appearance: Normal appearance.  HENT:     Head: Normocephalic.  Eyes:     Conjunctiva/sclera: Conjunctivae normal.  Cardiovascular:     Rate and Rhythm: Normal rate and regular rhythm.     Pulses: Normal pulses.     Heart sounds: Normal heart sounds.  Pulmonary:     Effort: Pulmonary effort is normal.     Breath sounds: Normal breath sounds.  Musculoskeletal:     Cervical back: Normal range of motion.  Skin:    General: Skin is warm.     Comments: Scabbed and open areas to bilateral legs, left > right  Neurological:     General: No focal deficit present.     Mental Status: He is alert and oriented to person, place, and time.  Psychiatric:        Mood and Affect: Mood normal.        Behavior: Behavior normal.        Thought Content:  Thought content normal.        Judgment: Judgment normal.        Assessment & Plan:   Problem List Items Addressed This Visit       Endocrine   Type 2 diabetes mellitus with diabetic neuropathy, unspecified (HCC) - Primary   Chronic, stable. His diabetes is well-controlled with an A1c of 6.6%. Continue metformin  1,000mg  daily. Recommend yearly eye exam. Glucometer ordered. Check urine microalbumin today. Follow-up in 2 months.       Relevant Orders   Microalbumin / creatinine urine ratio     Other   Long term current use of oral hypoglycemic drug   Continue metformin  XR 1,000mg  daily      Postnasal drip   He experiences symptoms of post-nasal drip and hoarseness. He is out of Claritin  but has Flonase  nasal spray available. Continue Flonase  nasal spray, claritin  refill sent to the pharmacy.       Depression, major, single episode, moderate (HCC)   Chronic, improving but not controlled. Symptoms have improved with the current Zoloft  regimen, though some irritability persists. He denies SI/HI. Increase zoloft  to 100mg  daily. Follow-up in 2 months.       Relevant Medications   sertraline  (ZOLOFT ) 100 MG tablet   Other Visit Diagnoses       Rash       The rash is not itchy or painful. Improvement noted with dietary changes. Start mupirocin  cream BID to open areas.      Return in about 3 months (around 01/01/2025) for 2-3 months , Diabetes.    Tinnie DELENA Harada, NP

## 2024-10-03 NOTE — Telephone Encounter (Signed)
 Pharmacy Patient Advocate Encounter   Received notification from Physician's Office that prior authorization for FreeStyle Libre 3 Sensor is required/requested.   Insurance verification completed.   The patient is insured through [facility].   Per test claim: PA required; PA submitted to above mentioned insurance via Latent Key/confirmation #/EOC [facility] Status is pending

## 2024-10-03 NOTE — Assessment & Plan Note (Signed)
 Chronic, improving but not controlled. Symptoms have improved with the current Zoloft  regimen, though some irritability persists. He denies SI/HI. Increase zoloft  to 100mg  daily. Follow-up in 2 months.

## 2024-10-03 NOTE — Assessment & Plan Note (Signed)
Continue metformin XR 1,000mg  daily.

## 2024-10-03 NOTE — Telephone Encounter (Signed)
 Can we get a prior auth on [employer]?

## 2024-10-03 NOTE — Telephone Encounter (Signed)
 PA request has been Submitted. New Encounter has been or will be created for follow up. For additional info see Pharmacy Prior Auth telephone encounter from 10/03/2024.

## 2024-10-03 NOTE — Assessment & Plan Note (Signed)
 Chronic, stable. His diabetes is well-controlled with an A1c of 6.6%. Continue metformin  1,000mg  daily. Recommend yearly eye exam. Glucometer ordered. Check urine microalbumin today. Follow-up in 2 months.

## 2024-10-03 NOTE — Assessment & Plan Note (Signed)
 He experiences symptoms of post-nasal drip and hoarseness. He is out of Claritin  but has Flonase  nasal spray available. Continue Flonase  nasal spray, claritin  refill sent to the pharmacy.

## 2024-10-04 ENCOUNTER — Other Ambulatory Visit: Payer: Self-pay | Admitting: Nurse Practitioner

## 2024-10-04 NOTE — Progress Notes (Signed)
 Gary Gay                                          MRN: [id number]   10/04/2024   The [facility] Quality Team Specialist reviewed this patient medical record for the purposes of chart review for care gap closure. The following were reviewed: abstraction for care gap closure-glycemic status assessment.    [facility]BCI Quality Team

## 2024-10-04 NOTE — Progress Notes (Signed)
 Gary Gay                                          MRN: [id number]   10/04/2024   The [facility] Quality Team Specialist reviewed this patient medical record for the purposes of chart review for care gap closure. The following were reviewed: chart review for care gap closure-diabetic eye exam and kidney health evaluation for diabetes:eGFR  and uACR.    [facility]BCI Quality Team

## 2024-10-04 NOTE — Telephone Encounter (Signed)
 I called patient and he is upset that [facility] is denying a medication but unsure which medication.     I called [facility] and the [employer] needs a prior auth. I called patient back and left message that we will start on a prior auth and let him know once completed.

## 2024-10-04 NOTE — Telephone Encounter (Signed)
 Requesting: SERTRALINE  50MG  TABLETS  Last Visit: 10/03/2024 Next Visit: Visit date not found Last Refill:   The original prescription was discontinued on 10/03/2024 by Nedra Tinnie LABOR, NP. Renewing this prescription may not be appropriate.    Please Advise

## 2024-10-04 NOTE — Telephone Encounter (Signed)
 Patient would like a call back from Brittany or the provider pertaining to a denial from his insurance company for a medication.    CB#[phone] (M)

## 2024-10-05 NOTE — Telephone Encounter (Signed)
 Pharmacy Patient Advocate Encounter  Received notification from [employer] that Prior Authorization for FreeStyle Libre 3 Sensor has been DENIED.  Full denial letter will be uploaded to the media tab. See denial reason below.  Records lacked proof of insulin  therapy or qualifying severe hypoglycemia per LCD [id number]; CGM request denied for unmet medical necessity criteria.   PA #/Case ID/Reference #: [id number]

## 2024-10-06 ENCOUNTER — Other Ambulatory Visit: Payer: Self-pay

## 2024-10-06 NOTE — Patient Instructions (Signed)
 Visit Information  Thank you for taking time to visit with me today. Please don't hesitate to contact me if I can be of assistance to you before our next scheduled appointment.  Your next care management appointment is by telephone on 11/08/24 at 1:00 pm  Please call the care guide team at [phone] if you need to cancel, schedule, or reschedule an appointment.   Please call the Suicide and Crisis Lifeline: 988 call the [place]  National Suicide Prevention Lifeline: (640)405-2083 or TTY: 9140399201 TTY [phone]) to talk to a trained counselor if you are experiencing a Mental Health or Behavioral Health Crisis or need someone to talk to.  Heddy Shutter, RN, MSN, BSN, CCM [place]  [facility], Population Health Case Manager Phone: [phone]

## 2024-10-06 NOTE — Patient Outreach (Signed)
 Complex Care Management   Visit Note  10/06/2024  Name:  Gary Gay MRN: [id number] DOB: 06-27-1956  Situation: Referral received for Complex Care Management related to  Complex Care Management, SDOH needs: Housing instability, Food insecurity  I obtained verbal consent from Patient.  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    Endocrine Endocrine Symptoms Reported: No symptoms reported Is patient diabetic?: Yes Is patient checking blood sugars at home?: Yes List most recent blood sugar readings, include date and time of day: BS 118 today.    Gastrointestinal Gastrointestinal Symptoms Reported: No symptoms reported      Genitourinary Genitourinary Symptoms Reported: No symptoms reported    Integumentary Integumentary Symptoms Reported: No symptoms reported    Musculoskeletal Musculoskelatal Symptoms Reviewed: No symptoms reported Additional Musculoskeletal Details: reports continues to use Cane for ambulation        Psychosocial Psychosocial Symptoms Reported: Other Additional Psychological Details: patient reports improvement in depression symptoms since beginning zoloft .. Behavioral Management Strategies: Medication therapy, Adequate rest, Activity           10/03/2024    9:05 AM 09/02/2024   10:51 AM 08/22/2024    2:41 PM 06/02/2024   10:50 AM 03/02/2024   12:02 PM   Depression screen PHQ 2/9  Decreased Interest 0 2 0 2 2  Down, Depressed, Hopeless 3 2 3 1 2   PHQ - 2 Score 3 4 3 3 4   Altered sleeping 3 3 3 1 2   Tired, decreased energy 0 2 0 1 2  Change in appetite 2 3 0 1 2  Feeling bad or failure about yourself  2 3 1 1 2   Trouble concentrating 0 2 0 1 2  Moving slowly or fidgety/restless 2 3 0 2 2  Suicidal thoughts 2 3 0 2 2  PHQ-9 Score 14 23 7  12  18    Difficult doing work/chores Somewhat difficult Very difficult Somewhat difficult Not difficult at all Somewhat difficult     Data saved with a previous flowsheet row definition   There were no vitals filed for this visit. Pain Scale: 0-10 Pain Score: 0-No pain  Medications Reviewed Today     Reviewed by Voncille Simm M, RN (Registered Nurse) on 10/06/24 at 1307  Med List Status: <None>   Medication Order Taking? Sig Documenting Provider Last Dose Status Informant  aspirin  EC 81 MG tablet [id number] Yes  [provider]  Active   atorvastatin  (LIPITOR) 80 MG tablet [id number] Yes Take 1 tablet (80 mg total) by mouth daily. Nedra Tinnie LABOR, NP  Active   Blood Glucose Monitoring Suppl DEVI [id number]  1 each by Does not apply route daily at 6 (six) AM. Dispense based on patient and insurance preference Check sugar daily. (FOR ICD-10 E10.9, E11.9). Nedra Tinnie LABOR, NP  Active   Continuous Glucose Sensor (FREESTYLE LIBRE 3 PLUS SENSOR) MISC [id number]  Change sensor every 15  days.  Patient not taking: Reported on 10/06/2024   Nedra Tinnie LABOR, NP  Active   diclofenac  Sodium (VOLTAREN ) 1 % GEL [id number] Yes Apply 2 g topically 4 (four) times daily. McElwee, Lauren A, NP  Active   fluticasone  (FLONASE ) 50 MCG/ACT nasal spray [id number] Yes Place 2 sprays into both nostrils daily. McElwee, Lauren A, NP  Active   gabapentin  (NEURONTIN ) 300 MG capsule [id number] Yes Take 2 capsules (600 mg total) by mouth 3 (three) times daily. McElwee, Lauren A, NP  Active   Glucose Blood (BLOOD GLUCOSE  TEST STRIPS) STRP [id number]  1 each by Does not apply route daily at 6 (six) AM. Dispense based on patient and insurance preference. Check sugar daily. (FOR ICD-10 E10.9, E11.9). McElwee, Lauren A, NP  Active   Iron , Ferrous Sulfate , 325 (65 Fe) MG TABS [id number] Yes Take 325 mg by mouth daily. McElwee, Lauren A, NP  Active   loratadine  (CLARITIN ) 10 MG tablet [id number] Yes Take 1 tablet (10 mg total) by mouth daily. McElwee, Lauren A, NP  Active   losartan -hydrochlorothiazide  (HYZAAR) 50-12.5 MG tablet [id number] Yes Take 1 tablet by mouth daily. McElwee, Lauren A, NP  Active   metFORMIN  (GLUCOPHAGE -XR) 500 MG 24 hr tablet [id number] Yes Take 2 tablets (1,000 mg total) by mouth daily with breakfast. TAKE 2 TABLETS(1000 MG) BY MOUTH DAILY WITH BREAKFAST McElwee, Lauren A, NP  Active   mupirocin  ointment (BACTROBAN ) 2 % [id number] Yes Apply 1 Application topically 2 (two) times daily. McElwee, Lauren A, NP  Active   sertraline  (ZOLOFT ) 100 MG tablet [id number] Yes Take 1 tablet (100 mg total) by mouth daily. Nedra Tinnie LABOR, NP  Active           Recommendation:   Continue Current Plan of Care  Follow Up Plan:   Telephone follow up appointment date/time:  11/08/24 at 1:00 pm  Heddy Shutter, RN, MSN, BSN, CCM [place]  [facility], Population Health Case Manager Phone: [phone]

## 2024-10-07 ENCOUNTER — Other Ambulatory Visit: Payer: Self-pay | Admitting: Licensed Clinical Social Worker

## 2024-10-07 NOTE — Telephone Encounter (Signed)
 Patient notified that insurance will not cover [employer].

## 2024-10-07 NOTE — Patient Instructions (Signed)
 Visit Information  Thank you for taking time to visit with me today. Please don't hesitate to contact me if I can be of assistance to you before our next scheduled appointment.  Our next appointment is by telephone on 10/18/24 at 10:30am.  Please call the care guide team at [phone] if you need to cancel or reschedule your appointment.   Following is a copy of your care plan:   Goals Addressed             This Visit's Progress    [facility]BCI Social Work Care Plan LCSW       Problems:   Corporate Treasurer , Housing , and Transportation  CSW Clinical Goal(s):   Over the next 90 days the Patient will explore community resource options for unmet needs related to [employer] , Housing , and Transportation as evidence by using community resources.  Over the next 2 weeks patient will call Senior apartment complexes to ask about vacancies and requirements to move in.  Interventions:  Social Determinants of Health in Patient with Depression: depressed mood: SDOH assessments completed: Corporate Treasurer , Housing , and Transportation Evaluation of current treatment plan related to unmet needs Transportation resources: HOME DEPOT rides, bus Public Librarian house- left callback number Discussed VA services- patient attempted to apply for assistance but did not continue with process Discussed income (receives$3,000+ a month) Discussed getting on apartment with current income Discussed mental health support- declined 09/02/24 LCSW met with patient in person Gave paper resources Discussed plan for finding home 10/07/24 Discussed other housing options Discussed [employer] transportation Discussed additional food resources  Patient Goals/Self-Care Activities: Continue with provider for ongoing medical treatment.   Continue taking your medication as prescribed.    Plan:   Face to Face appointment with care management team member scheduled for: 09/13/24 at 2pm.        Please call 911 if you  are experiencing a Mental Health or Behavioral Health Crisis or need someone to talk to.  Patient verbalized understanding of Care plan and visit instructions communicated this visit  Cena Ligas, LCSW Clinical Social Worker [facility] [employer]

## 2024-10-07 NOTE — Patient Outreach (Signed)
 Complex Care Management   Visit Note  10/07/2024  Name:  Gary Gay MRN: [id number] DOB: 1956/05/13  Situation: Referral received for Complex Care Management related to SDOH Barriers:  Housing. I obtained verbal consent from Patient.  Visit completed with Patient  on the phone.  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 Patient stated that things are going. Patient stated there has been no other conflicts with his roommate and they are coexisting. LCSW shared information with patient about needing to put himself on the wait list for housing. Patient stated he could do that. LCSW and patient discussed looking at private landlords. Patient stated he was open to that. LCSW and patient also discussed alternate transportation (scat) if/when needed. Patient also inquired about food pantries in the area. LCSW offered to mail all of this iformation to patient. Patient stated yes and LCSW confirmed address.   Patient Reported Symptoms:  Cognitive Cognitive Status: Normal speech and language skills, Alert and oriented to person, place, and time Cognitive/Intellectual Conditions Management [RPT]: None reported or documented in medical history or problem list   Health Maintenance Behaviors: Annual physical exam Healing Pattern: Average Health Facilitated by: Prayer/meditation, Rest  Neurological Neurological Review of Symptoms: Not assessed    HEENT HEENT Symptoms Reported: Not assessed      Cardiovascular Cardiovascular Symptoms Reported: Not assessed    Respiratory Respiratory Symptoms Reported: Not assesed    Endocrine Endocrine Symptoms Reported: Not assessed    Gastrointestinal Gastrointestinal Symptoms Reported: Not assessed      Genitourinary Genitourinary Symptoms Reported: Not assessed    Integumentary Integumentary  Symptoms Reported: Not assessed    Musculoskeletal Musculoskelatal Symptoms Reviewed: Not assessed        Psychosocial Psychosocial Symptoms Reported: Not assessed          10/07/2024    PHQ2-9 Depression Screening   Little interest or pleasure in doing things    Feeling down, depressed, or hopeless    PHQ-2 - Total Score    Trouble falling or staying asleep, or sleeping too much    Feeling tired or having little energy    Poor appetite or overeating     Feeling bad about yourself - or that you are a failure or have let yourself or your family down    Trouble concentrating on things, such as reading the newspaper or watching television    Moving or speaking so slowly that other people could have noticed.  Or the opposite - being so fidgety or restless that you have been moving around a lot more than usual    Thoughts that you would be better off dead, or hurting yourself in some way    PHQ2-9 Total Score    If you checked off any problems, how difficult have these problems made it for you to do your work, take care of things at home, or get along with other people    Depression Interventions/Treatment      There were no vitals filed for this visit.    Medications Reviewed Today     Reviewed by Veva Bolt, LCSW (Social Worker) on 10/07/24 at 1424  Med List Status: <None>   Medication Order Taking? Sig Documenting Provider Last Dose Status Informant  aspirin  EC 81 MG tablet [id number] Yes  [provider]  Active  atorvastatin  (LIPITOR) 80 MG tablet [id number] Yes Take 1 tablet (80 mg total) by mouth daily. Nedra Tinnie LABOR, NP  Active   Blood Glucose Monitoring Suppl DEVI [id number]  1 each by Does not apply route daily at 6 (six) AM. Dispense based on patient and insurance preference Check sugar daily. (FOR ICD-10 E10.9, E11.9). Nedra Tinnie LABOR, NP  Active   Continuous Glucose Sensor (FREESTYLE LIBRE 3 PLUS SENSOR) MISC [id number]  Change sensor every 15 days.   Patient not taking: Reported on 10/06/2024   Nedra Tinnie LABOR, NP  Active   diclofenac  Sodium (VOLTAREN ) 1 % GEL [id number] Yes Apply 2 g topically 4 (four) times daily. McElwee, Lauren A, NP  Active   fluticasone  (FLONASE ) 50 MCG/ACT nasal spray [id number] Yes Place 2 sprays into both nostrils daily. McElwee, Lauren A, NP  Active   gabapentin  (NEURONTIN ) 300 MG capsule [id number] Yes Take 2 capsules (600 mg total) by mouth 3 (three) times daily. McElwee, Lauren A, NP  Active   Glucose Blood (BLOOD GLUCOSE TEST STRIPS) STRP [id number]  1 each by Does not apply route daily at 6 (six) AM. Dispense based on patient and insurance preference. Check sugar daily. (FOR ICD-10 E10.9, E11.9). McElwee, Lauren A, NP  Active   Iron , Ferrous Sulfate , 325 (65 Fe) MG TABS [id number] Yes Take 325 mg by mouth daily. McElwee, Lauren A, NP  Active   loratadine  (CLARITIN ) 10 MG tablet [id number]  Take 1 tablet (10 mg total) by mouth daily. McElwee, Lauren A, NP  Active   losartan -hydrochlorothiazide  (HYZAAR) 50-12.5 MG tablet [id number] Yes Take 1 tablet by mouth daily. McElwee, Lauren A, NP  Active   metFORMIN  (GLUCOPHAGE -XR) 500 MG 24 hr tablet [id number] Yes Take 2 tablets (1,000 mg total) by mouth daily with breakfast. TAKE 2 TABLETS(1000 MG) BY MOUTH DAILY WITH BREAKFAST McElwee, Lauren A, NP  Active   mupirocin  ointment (BACTROBAN ) 2 % [id number]  Apply 1 Application topically 2 (two) times daily. McElwee, Lauren A, NP  Active   sertraline  (ZOLOFT ) 100 MG tablet [id number]  Take 1 tablet (100 mg total) by mouth daily. Nedra Tinnie LABOR, NP  Active             Recommendation:   PCP Follow-up Continue Current Plan of Care  Follow Up Plan:   Telephone follow up appointment date/time:  10/18/24 at 10:30am.  Cena Ligas, LCSW Clinical Social Worker [facility]

## 2024-10-18 ENCOUNTER — Telehealth: Payer: Self-pay | Admitting: Licensed Clinical Social Worker

## 2024-10-25 NOTE — Progress Notes (Signed)
 Joseh Sjogren                                          MRN: [id number]   10/25/2024   The [facility]BCI Quality Team Specialist reviewed this patient medical record for the purposes of chart review for care gap closure. The following were reviewed: chart review for care gap closure-diabetic eye exam and kidney health evaluation for diabetes:eGFR  and uACR.    [facility]BCI Quality Team

## 2024-10-28 ENCOUNTER — Telehealth: Payer: Self-pay

## 2024-10-28 NOTE — Telephone Encounter (Signed)
 Patient notified form completed and will pick up.

## 2024-10-28 NOTE — Telephone Encounter (Signed)
 Copied from CRM [id number]. Topic: General - Other >> Oct 28, 2024 11:33 AM Berneda FALCON wrote: Reason for CRM: Patient states he wants the PCP to know he needs a document for the placecard for handicap sticker for vehicle. Is this something he would need an appt for?  Patient callback is [phone] (home)

## 2024-11-03 ENCOUNTER — Other Ambulatory Visit: Payer: Self-pay | Admitting: Nurse Practitioner

## 2024-11-03 NOTE — Telephone Encounter (Signed)
 Requesting: gabapentin  300 mg capsule  Last Visit: 10/03/2024 Next Visit: Visit date not found Last Refill: 08/24/2024  Please Advise

## 2024-11-08 ENCOUNTER — Telehealth: Payer: Self-pay

## 2024-11-08 NOTE — Patient Instructions (Signed)
 Gary Gay - I am sorry I was unable to reach you today for our scheduled appointment. I work with Nedra Tinnie LABOR, NP and am calling to support your healthcare needs. Please contact me at [phone] at your earliest convenience. I look forward to speaking with you soon.   Thank you,   Heddy Shutter, RN, MSN, BSN, CCM [place]  [facility], Population Health Case Manager Phone: [phone]

## 2024-11-30 ENCOUNTER — Telehealth: Payer: Self-pay

## 2024-11-30 NOTE — Telephone Encounter (Signed)
 Copied from CRM [id number]. Topic: Clinical - Medication Refill >> Nov 30, 2024  3:09 PM Alfonso HERO wrote: Medication: gabapentin  (NEURONTIN ) 300 MG capsule  Has the patient contacted their pharmacy? No (Agent: If no, request that the patient contact the pharmacy for the refill. If patient does not wish to contact the pharmacy document the reason why and proceed with request.) (Agent: If yes, when and what did the pharmacy advise?)  This is the patient's preferred pharmacy:   [facility] - [place], [place] - [address] [place] [address] Phone: [phone] Fax: [phone]  Is this the correct pharmacy for this prescription? Yes If no, delete pharmacy and type the correct one.   Has the prescription been filled recently? Yes  Is the patient out of the medication? Yes  Has the patient been seen for an appointment in the last year OR does the patient have an upcoming appointment? Yes  Can we respond through MyChart? Yes  Agent: Please be advised that Rx refills may take up to 3 business days. We ask that you follow-up with your pharmacy.

## 2024-11-30 NOTE — Telephone Encounter (Signed)
 I called [facility] and spoke with a representative and patient's Rx of Gabapentin  was mailed to him today.  She said please disregard previous message for a Rx refill.

## 2024-11-30 NOTE — Telephone Encounter (Signed)
 Requesting: gabapentin  (NEURONTIN ) 300 MG capsule  Last Visit: Visit date not found Next Visit: Visit date not found Last Refill: 11/03/2024 to [facility]  Please Advise

## 2024-12-06 ENCOUNTER — Telehealth: Payer: Medicare (Managed Care)

## 2025-03-24 ENCOUNTER — Ambulatory Visit
